# Patient Record
Sex: Male | Born: 1965
Health system: Southern US, Community
[De-identification: ages and names within clinical notes are randomized; demographics above are authoritative.]

## PROBLEM LIST (undated history)

## (undated) DIAGNOSIS — Z9889 Other specified postprocedural states: Secondary | ICD-10-CM

## (undated) DIAGNOSIS — M1990 Unspecified osteoarthritis, unspecified site: Secondary | ICD-10-CM

## (undated) DIAGNOSIS — N200 Calculus of kidney: Secondary | ICD-10-CM

## (undated) DIAGNOSIS — K469 Unspecified abdominal hernia without obstruction or gangrene: Secondary | ICD-10-CM

## (undated) DIAGNOSIS — R55 Syncope and collapse: Secondary | ICD-10-CM

## (undated) DIAGNOSIS — J309 Allergic rhinitis, unspecified: Secondary | ICD-10-CM

## (undated) DIAGNOSIS — J45909 Unspecified asthma, uncomplicated: Secondary | ICD-10-CM

## (undated) DIAGNOSIS — K4090 Unilateral inguinal hernia, without obstruction or gangrene, not specified as recurrent: Secondary | ICD-10-CM

## (undated) DIAGNOSIS — R112 Nausea with vomiting, unspecified: Secondary | ICD-10-CM

## (undated) HISTORY — DX: Unspecified abdominal hernia without obstruction or gangrene: K46.9

## (undated) HISTORY — DX: Allergic rhinitis, unspecified: J30.9

## (undated) HISTORY — DX: Unilateral inguinal hernia, without obstruction or gangrene, not specified as recurrent: K40.90

## (undated) HISTORY — DX: Unspecified osteoarthritis, unspecified site: M19.90

## (undated) HISTORY — DX: Unspecified asthma, uncomplicated: J45.909

## (undated) HISTORY — PX: HX GALL BLADDER SURGERY/CHOLE: SHX55

## (undated) HISTORY — PX: KNEE SURGERY: SHX244

## (undated) HISTORY — PX: HAND SURGERY: SHX662

## (undated) HISTORY — PX: HX HERNIA REPAIR: SHX51

## (undated) HISTORY — PX: HX SHOULDER SURGERY: 2100001311

---

## 1987-08-02 HISTORY — PX: HAND SURGERY: SHX662

## 2003-08-02 HISTORY — PX: KNEE SURGERY: SHX244

## 2004-08-01 HISTORY — PX: CHOLECYSTECTOMY: SHX55

## 2004-08-28 ENCOUNTER — Emergency Department (HOSPITAL_COMMUNITY): Admission: EM | Admit: 2004-08-28 | Discharge: 2004-08-29 | Payer: Self-pay | Admitting: Emergency Medicine

## 2004-09-30 ENCOUNTER — Encounter (INDEPENDENT_AMBULATORY_CARE_PROVIDER_SITE_OTHER): Payer: Self-pay | Admitting: Specialist

## 2004-09-30 ENCOUNTER — Ambulatory Visit (HOSPITAL_COMMUNITY): Admission: RE | Admit: 2004-09-30 | Discharge: 2004-09-30 | Payer: Self-pay | Admitting: General Surgery

## 2006-11-20 ENCOUNTER — Ambulatory Visit: Payer: Self-pay | Admitting: Cardiology

## 2006-12-07 ENCOUNTER — Ambulatory Visit: Payer: Self-pay | Admitting: Cardiology

## 2006-12-07 ENCOUNTER — Ambulatory Visit: Payer: Self-pay

## 2006-12-16 IMAGING — RF DG CHOLANGIOGRAM OPERATIVE
1 series · 4 of 4 positions shown · non-contrast
Comparison: none

CLINICAL DATA: Cholelithiasis, laparoscopic cholecystectomy.

INTRAOPERATIVE CHOLANGIOGRAM:
TECHNIQUE: Multiple fluoroscopic spot radiographs were obtained during
intraoperative cholangiogram, and are submitted for interpretation
post-operatively.

[Series 1: run · 4 of 52 frames shown]
[frame 8/52]
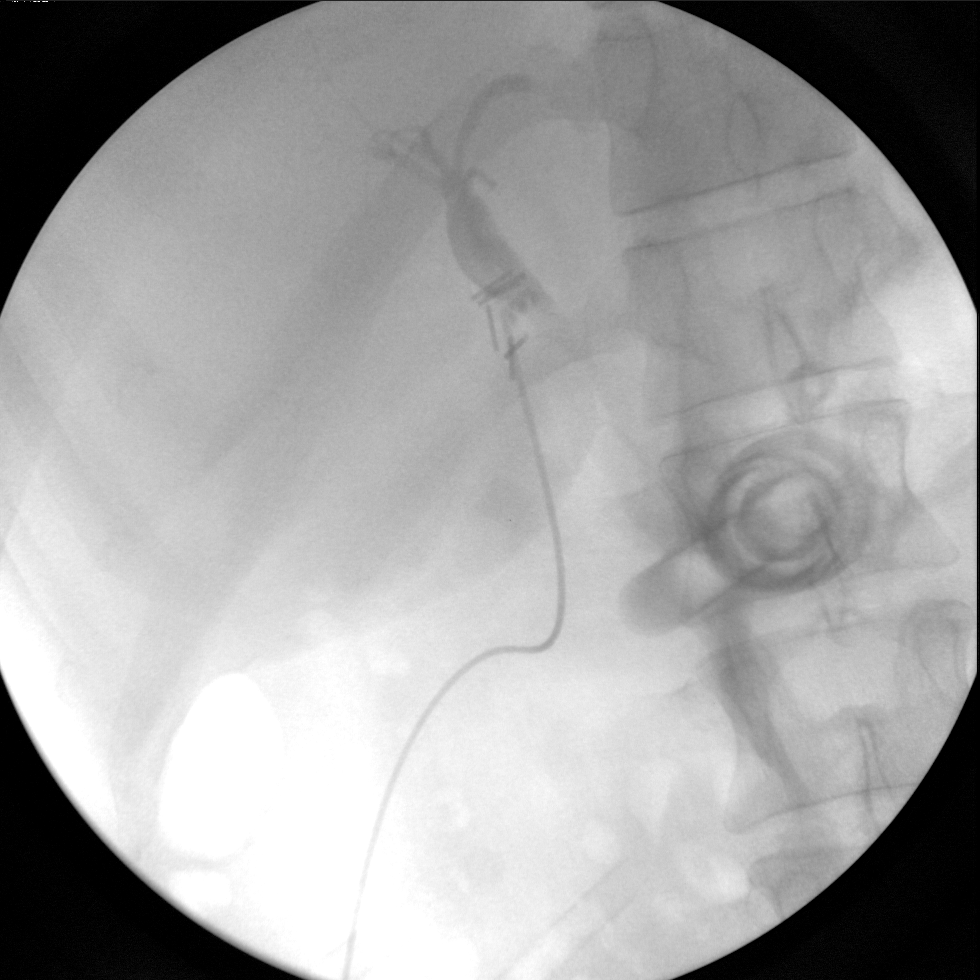
[frame 27/52]
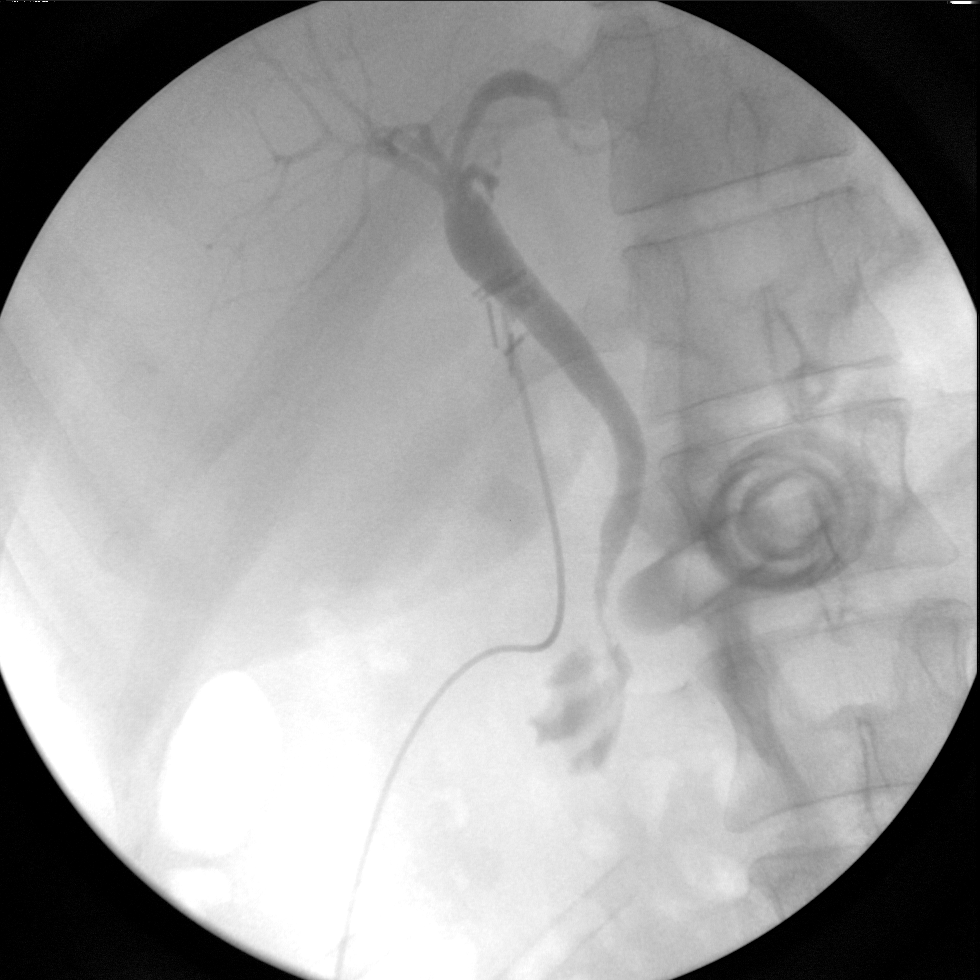
[frame 45/52]
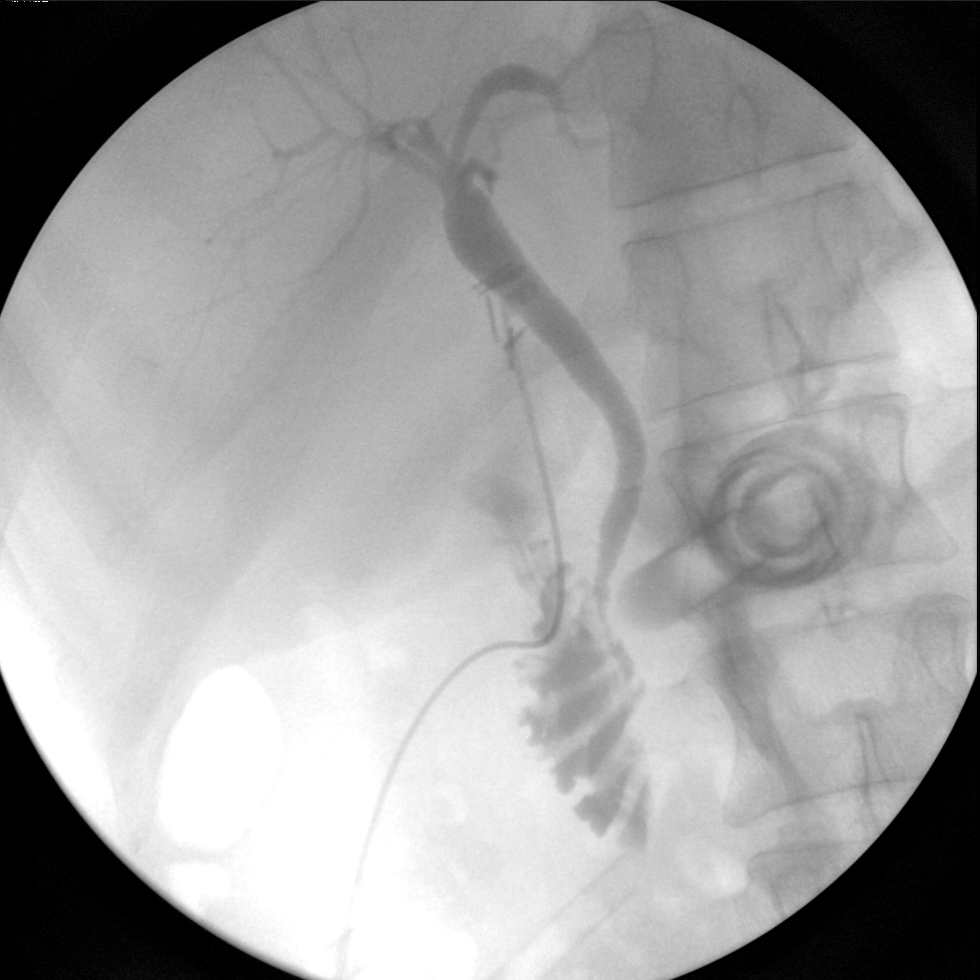
[frame 52/52]
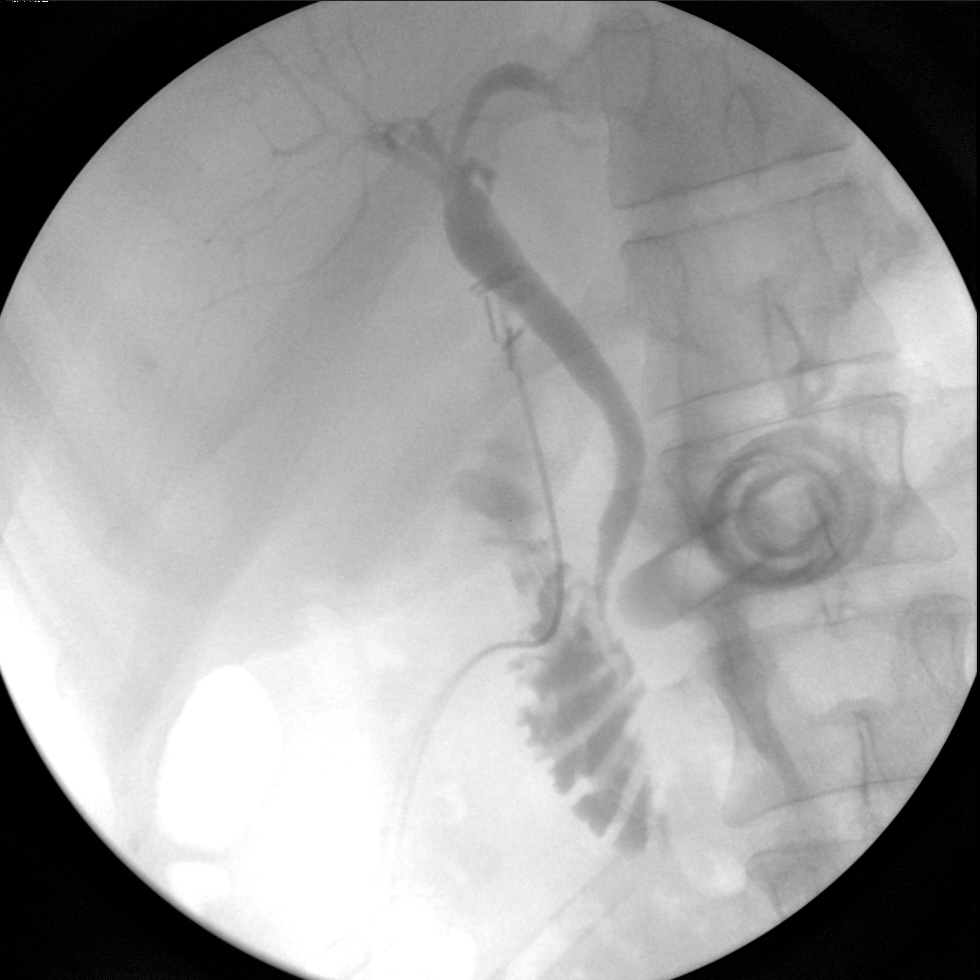

[4 of 4 positions shown; findings below may reference images not displayed]

FINDINGS: No calculi are identified within the common bile duct.  There is no
evidence of biliary stricture or obstruction.
IMPRESSION: Negative intraoperative cholangiogram.

## 2008-08-01 HISTORY — PX: SHOULDER SURGERY: SHX246

## 2009-04-21 DIAGNOSIS — Z9189 Other specified personal risk factors, not elsewhere classified: Secondary | ICD-10-CM | POA: Insufficient documentation

## 2009-04-21 DIAGNOSIS — Z9889 Other specified postprocedural states: Secondary | ICD-10-CM | POA: Insufficient documentation

## 2009-04-21 DIAGNOSIS — Z9089 Acquired absence of other organs: Secondary | ICD-10-CM | POA: Insufficient documentation

## 2010-12-14 NOTE — Procedures (Signed)
[facility]                              EXERCISE TREADMILL   NAME:[name], [name]                    MRN:          [id number]  DATE:12/07/2006                            DOB:          Jun 23, 1966    EXERCISE TREADMILL   The patient is a 45 year old male who recently presented with atypical  chest pain.  This study is performed for risk stratification.   The patient exercised for a duration of 10 minutes and 22 seconds on the  Bruce Protocol, which is equivalent of 12.3 METS.  His heart rate  increased from a resting of 90 to a maximum of 173, which is 95% of his  predicted maximum.  His blood pressure increased from 114/80 to 156/78.  There was no chest pain during this study.  There were no ST changes.  The study was terminated secondary to leg fatigue.   Final interpretation, negative adequate exercise tolerance test with no  chest pain, excellent exercise tolerance, and no ST changes.     Madolyn Frieze Jens Som, MD, [facility]  Electronically Signed    BSC/MedQ  DD: 12/07/2006  DT: 12/07/2006  Job #: [id number]   cc:   Anglea Greggory Stallion

## 2010-12-17 NOTE — Assessment & Plan Note (Signed)
[facility]                            CARDIOLOGY OFFICE NOTE   NAME:ROBINSONSophia, Cubero                    MRN:          [id number]  DATE:11/20/2006                            DOB:          20-Aug-1965    HISTORY OF PRESENT ILLNESS:  Mr. Thone is a very pleasant 45 year old  male with no prior cardiac history who presents for evaluation of chest  pain.  The patient typically does not have dyspnea on exertion,  orthopnea, PND, pedal edema, palpitations, presyncope, syncope, or  exertional chest pain.  Two to three months ago he had several episodes  of epigastric/substernal chest pain.  It lasted for several minutes and  resolved spontaneously.  It was described as a sharp pain.  There was no  radiation nor was there associated shortness of breath, nausea,  vomiting, or diaphoresis.  It was not pleuritic or positional nor was it  related to food.  It was not related to exertion.  He was concerned  about these symptoms and the development of coronary disease and  presented for further evaluation as a referral from Dr. Charm Barges.   MEDICATIONS:  His medications at present include bupropion 150 mg p.o.  daily and cetirizine 10 mg p.o. daily.  He also uses albuterol as  needed.   ALLERGIES:  HE HAS AN ALLERGY TO PENICILLIN.   FAMILY HISTORY:  Negative for coronary artery disease in his immediate  family.   SOCIAL HISTORY:  He has a remote history of tobacco use but has not  smoked since 1994.  He occasionally consumes alcohol.   PAST MEDICAL HISTORY:  There is no diabetes mellitus or hypertension.  He states his cholesterol has been borderline recently.  He has a remote  history of asthma.  He has had prior left knee surgery.  He has also had  surgery on his right hand due to an accident.  He has had a prior  cholecystectomy.   REVIEW OF SYSTEMS:  There is no headaches or fevers or chills.  There is  no productive cough or hemoptysis.  There is no  dysphagia, odynophagia,  melena, or hematochezia.  There is no dysuria or hematuria.  There is no  rash or seizure activity.  There is no orthopnea, PND, or pedal edema.  There is no claudication.  The remaining systems are negative.   PHYSICAL EXAMINATION:  VITAL SIGNS:  His physical exam today shows a  blood pressure of 124/70 and his pulse is 90.  He weighs 153 pounds.  GENERAL:  He is well-developed, well-nourished, no acute distress.  SKIN:  Warm and dry.  He does have a tattoo on his right shoulder.  He  does not appear to be depressed and there is no peripheral clubbing.  His back is normal.  HEENT:  Normal with normal eyelids.  His neck is supple with a normal  upstroke bilaterally and I cannot appreciate bruits.  There is no  jugular venous distention and no hepatomegaly is noted.  CHEST:  Clear to auscultation.  Normal expansion.  CARDIOVASCULAR:  Reveals regular rate and rhythm.  Normal S1  and S2.  There are no murmurs, rubs, or gallops noted.  His PMI is nondisplaced.  ABDOMINAL EXAM:  Nontender, nondistended, positive bowel sounds, no  hepatosplenomegaly, no mass appreciated.  There is no abdominal bruit.  He has 2+ femoral pulses bilaterally.  No bruits.  EXTREMITIES:  Show no edema and I can palpate no cords.  He has 2+  posterior tibial pulses bilaterally.  NEUROLOGIC:  His neurological exam is grossly intact.   ELECTROCARDIOGRAM:  His electrocardiogram shows a sinus rhythm at a rate  of 90.  There were no ST changes noted.   DIAGNOSES:  1. Atypical chest pain - his symptoms are very atypical but he is      concerned about these and the development of coronary disease.  We      will schedule him to have an exercise treadmill.  If it is normal      then we would not pursue further cardiac workup.  I discussed the      importance of risk factor modification today including avoiding      tobacco use, exercise and diet.  I will see him back on an as      needed basis  pending the results of his treadmill.  2. History of asthma - per primary care.     Madolyn Frieze Jens Som, MD, [facility]  Electronically Signed    BSC/MedQ  DD: 11/20/2006  DT: 11/20/2006  Job #: [id number]   cc:   [facility] Prudy Feeler MD  Samuel Jester

## 2010-12-17 NOTE — Op Note (Signed)
NAME:  Lance Murphy, Lance Murphy             ACCOUNT NO.:  [id number]   MEDICAL RECORD NO.:  [id number]          PATIENT TYPE:  OIB   LOCATION:  2550                         FACILITY:  [facility]   PHYSICIAN:  Cherylynn Ridges III, M.D.DATE OF BIRTH:  [birth year]   DATE OF PROCEDURE:  09/30/2004  DATE OF DISCHARGE:                                 OPERATIVE REPORT   PREOPERATIVE DIAGNOSES:  Symptomatic cholelithiasis.   POSTOPERATIVE DIAGNOSES:  Symptomatic cholelithiasis.   OPERATION PERFORMED:  Laparoscopic cholecystectomy with cholangiogram.   SURGEON:  Marta Lamas. Lindie Spruce, M.D.   ANESTHESIA:  General endotracheal.   ESTIMATED BLOOD LOSS:  Less than 20 mL.   COMPLICATIONS:  None.   CONDITION:  Stable.   FINDINGS:  Normal cholangiogram.  Very few stones in the gallbladder.  Some  evidence of acute inflammation with adhesions to the gallbladder.   INDICATIONS FOR PROCEDURE:  The patient is a 44 year old male who had been  seen in the emergency room with gallstones and symptoms related to those,  who now comes in for an elective laparoscopic cholecystectomy.   DESCRIPTION OF PROCEDURE:  The patient was taken to the operating room and  placed on the table in the supine position.  After an adequate endotracheal  anesthetic was administered, the patient was prepped and draped in the usual  sterile manner exposing the midline in the right upper quadrant.  Initially,  a supraumbilical curvilinear incision was made using a #11 blade and taken  down to the midline fascia.  Through this midline fascia, a Veress needle  was passed into the peritoneal cavity and confirmed to be in the correct  position using the saline test.  Once this was done, carbon dioxide gas was  insufflated through the Veress needle into the peritoneal cavity up to a  maximal intra-abdominal pressure of 15 mmHg.  Once this was done and the 11-  12 mm was passed with the OptiVue entering cannula and the scope inserted  into the  cavity under direct vision.  We confirmed its placement with a  laparoscope and attached camera and light source and then subsequently  passed two right costal margin 5 mm cannulas in the subxiphoid 10-11 mm  cannula under direct vision.  The patient was placed in steep reverse  Trendelenburg, the left side was tilted down and the dissection begun.  The  dome of the gallbladder was grasped using a ratcheted grasper through the  lateral most 5 mm cannula.  It was retracted towards the anterior abdominal  wall and the right upper quadrant. There were adhesions to the body and  infundibulum of the gallbladder which were bluntly dissected away and  sharply dissected away using electrocautery and a dissector.  This helped [place]  expose the peritoneum over the triangle of Calot and the hepatoduodenal  triangle.  By dissecting away this peritoneum, the cystic duct and the  cystic artery could be identified.  Both were adequately mobilized with  windows around them and you could see on both sides, and see the cystic duct  clearly going to the gallbladder.  We endoclipped the cystic duct  along the  gallbladder side and then doubly clipped the cystic artery distally and  singly proximally and transected that.  We made a cholecystodochotomy using  the endoscopic scissors and through this cholecystodochotomy a Cook catheter  was passed for the cholangiogram. this was done showing good flow into the  duodenum.  No filling defects, tapering distally, proximal filling without  evidence of obstruction or dilatation.   Once the cholangiogram was completed, we removed the securing clip, removed  the catheter and then triply clipped the cystic duct distal to the  cholecystodochotomy.  We transected the cystic duct and then dissected the  gallbladder out of its hepatic bed with minimal difficulty.  There was no  entrance into the gallbladder and we were able to retrieve it from the  supraumbilical site with  minimal difficulty without using an EndoCatch bag.   Once the gallbladder was removed, we inspected the bed and there was minimal  to no bleeding.  We irrigated with about 300 to 400 cc of warm saline  solution, then removed all gas from above the liver using the aspirator and  then removed all cannulas.  0.25% Marcaine with epinephrine was injected at  all the incision sites, then this totaled about approximately 20 cc.  We  then reapproximated the supraumbilical fascia using a figure-of-eight stitch  of 0 Vicryl passed on a UR-6 needle.  The skin at all sites was closed using  a running subcuticular stitch of 4-0 Vicryl.  Steri-Strips were applied to  all wounds, then a sterile dressing.  All sponge, needle and instrument  counts were correct at the conclusion of the case.      JOW/MEDQ  D:  09/30/2004  T:  09/30/2004  Job:  [id number]

## 2012-07-30 ENCOUNTER — Encounter (INDEPENDENT_AMBULATORY_CARE_PROVIDER_SITE_OTHER): Payer: Self-pay | Admitting: General Surgery

## 2012-08-02 ENCOUNTER — Encounter (INDEPENDENT_AMBULATORY_CARE_PROVIDER_SITE_OTHER): Payer: Self-pay | Admitting: General Surgery

## 2012-08-02 ENCOUNTER — Ambulatory Visit (INDEPENDENT_AMBULATORY_CARE_PROVIDER_SITE_OTHER): Payer: 59 | Admitting: General Surgery

## 2012-08-02 VITALS — BP 128/76 | HR 74 | Temp 97.9°F | Resp 18 | Ht 70.0 in | Wt 152.2 lb

## 2012-08-02 DIAGNOSIS — K4020 Bilateral inguinal hernia, without obstruction or gangrene, not specified as recurrent: Secondary | ICD-10-CM

## 2012-08-02 NOTE — Patient Instructions (Signed)
Your exam today confirms an obvious reducible right inguinal hernia. There also appears to be a weakness and slight bulge on the left side.  After lengthy discussion, we have decided to go ahead with scheduling of laparoscopic repair of bilateral inguinal hernias with mesh, possible open     Inguinal Hernia, Adult Muscles help keep everything in the body in its proper place. But if a weak spot in the muscles develops, something can poke through. That is called a hernia. When this happens in the lower part of the belly (abdomen), it is called an inguinal hernia. (It takes its name from a part of the body in this region called the inguinal canal.) A weak spot in the wall of muscles lets some fat or part of the small intestine bulge through. An inguinal hernia can develop at any age. Men get them more often than women. CAUSES  In adults, an inguinal hernia develops over time.  It can be triggered by:  Suddenly straining the muscles of the lower abdomen.  Lifting heavy objects.  Straining to have a bowel movement. Difficult bowel movements (constipation) can lead to this.  Constant coughing. This may be caused by smoking or lung disease.  Being overweight.  Being pregnant.  Working at a job that requires long periods of standing or heavy lifting.  Having had an inguinal hernia before. One type can be an emergency situation. It is called a strangulated inguinal hernia. It develops if part of the small intestine slips through the weak spot and cannot get back into the abdomen. The blood supply can be cut off. If that happens, part of the intestine may die. This situation requires emergency surgery. SYMPTOMS  Often, a small inguinal hernia has no symptoms. It is found when a healthcare provider does a physical exam. Larger hernias usually have symptoms.   In adults, symptoms may include:  A lump in the groin. This is easier to see when the person is standing. It might disappear when lying  down.  In men, a lump in the scrotum.  Pain or burning in the groin. This occurs especially when lifting, straining or coughing.  A dull ache or feeling of pressure in the groin.  Signs of a strangulated hernia can include:  A bulge in the groin that becomes very painful and tender to the touch.  A bulge that turns red or purple.  Fever, nausea and vomiting.  Inability to have a bowel movement or to pass gas. DIAGNOSIS  To decide if you have an inguinal hernia, a healthcare provider will probably do a physical examination.  This will include asking questions about any symptoms you have noticed.  The healthcare provider might feel the groin area and ask you to cough. If an inguinal hernia is felt, the healthcare provider may try to slide it back into the abdomen.  Usually no other tests are needed. TREATMENT  Treatments can vary. The size of the hernia makes a difference. Options include:  Watchful waiting. This is often suggested if the hernia is small and you have had no symptoms.  No medical procedure will be done unless symptoms develop.  You will need to watch closely for symptoms. If any occur, contact your healthcare provider right away.  Surgery. This is used if the hernia is larger or you have symptoms.  Open surgery. This is usually an outpatient procedure (you will not stay overnight in a hospital). An cut (incision) is made through the skin in the groin. The  hernia is put back inside the abdomen. The weak area in the muscles is then repaired by herniorrhaphy or hernioplasty. Herniorrhaphy: in this type of surgery, the weak muscles are sewn back together. Hernioplasty: a patch or mesh is used to close the weak area in the abdominal wall.  Laparoscopy. In this procedure, a surgeon makes small incisions. A thin tube with a tiny video camera (called a laparoscope) is put into the abdomen. The surgeon repairs the hernia with mesh by looking with the video camera and using  two long instruments. HOME CARE INSTRUCTIONS   After surgery to repair an inguinal hernia:  You will need to take pain medicine prescribed by your healthcare provider. Follow all directions carefully.  You will need to take care of the wound from the incision.  Your activity will be restricted for awhile. This will probably include no heavy lifting for several weeks. You also should not do anything too active for a few weeks. When you can return to work will depend on the type of job that you have.  During "watchful waiting" periods, you should:  Maintain a healthy weight.  Eat a diet high in fiber (fruits, vegetables and whole grains).  Drink plenty of fluids to avoid constipation. This means drinking enough water and other liquids to keep your urine clear or pale yellow.  Do not lift heavy objects.  Do not stand for long periods of time.  Quit smoking. This should keep you from developing a frequent cough. SEEK MEDICAL CARE IF:   A bulge develops in your groin area.  You feel pain, a burning sensation or pressure in the groin. This might be worse if you are lifting or straining.  You develop a fever of more than 100.5 F (38.1 C). SEEK IMMEDIATE MEDICAL CARE IF:   Pain in the groin increases suddenly.  A bulge in the groin gets bigger suddenly and does not go down.  For men, there is sudden pain in the scrotum. Or, the size of the scrotum increases.  A bulge in the groin area becomes red or purple and is painful to touch.  You have nausea or vomiting that does not go away.  You feel your heart beating much faster than normal.  You cannot have a bowel movement or pass gas.  You develop a fever of more than 102.0 F (38.9 C). Document Released: 12/04/2008 Document Revised: 10/10/2011 Document Reviewed: 12/04/2008 [facility] Patient Information 2013 [place], [place].

## 2012-08-02 NOTE — Progress Notes (Signed)
Patient ID: Lance Murphy, male   DOB: 08-13-1965, 47 y.o.   MRN: [id number]  Chief Complaint  Patient presents with  . Inguinal Hernia    HPI Lance Murphy is a 47 y.o. male.  He is referred by Prudy Feeler, PA at [facility] in stools, for evaluation of a symptomatic right inguinal hernia.  This gentleman is here today with his wife. He works in [employer]. They travel from job site to job site and have a large trailer. His health is good except for some occasional asthma symptoms. He's had a laparoscopic cholecystectomy in 2008. He had an injury to his right hand several years ago requiring a right groin flap temporarily.  he does not smoke.  He noticed a bulge in his right groin about one month ago. It's been getting larger and he has a double which is reducible. He gets a pulling sensation. No symptoms elsewhere. HPI  Past Medical History  Diagnosis Date  . Allergic rhinitis   . Osteoarthritis   . Asthma   . Hernia of unspecified site of abdominal cavity without mention of obstruction or gangrene   . Inguinal hernia     Past Surgical History  Procedure Date  . Knee surgery 2005    due to accident  . Shoulder surgery 2010    due to accident  . Cholecystectomy 2006  . Hand surgery 1989    History reviewed. No pertinent family history.  Social History History  Substance Use Topics  . Smoking status: Former Smoker    Quit date: 08/02/1991  . Smokeless tobacco: Never Used  . Alcohol Use: No    Allergies  Allergen Reactions  . Cortisone Other (See Comments)    Syncope  . Penicillins     Does not know of the reaction, happened in childhood.    Current Outpatient Prescriptions  Medication Sig Dispense Refill  . Acetaminophen (TYLENOL 8 HOUR PO) Take by mouth as needed.      . B Complex Vitamins (VITAMIN B COMPLEX PO) Take by mouth as directed.      . cetirizine (ZYRTEC) 10 MG tablet Take 10 mg by mouth daily.      . Multiple Vitamins-Minerals  (MULTIVITAMIN PO) Take by mouth as directed.      . Omega-3 Fatty Acids (FISH OIL) 1000 MG CAPS Take by mouth daily.      Marland Kitchen albuterol (PROVENTIL HFA;VENTOLIN HFA) 108 (90 BASE) MCG/ACT inhaler Inhale 2 puffs into the lungs daily.        Review of Systems Review of Systems  Constitutional: Negative for fever, chills and unexpected weight change.  HENT: Negative for hearing loss, congestion, sore throat, trouble swallowing and voice change.   Eyes: Negative for visual disturbance.  Respiratory: Negative for cough and wheezing.   Cardiovascular: Negative for chest pain, palpitations and leg swelling.  Gastrointestinal: Negative for nausea, vomiting, abdominal pain, diarrhea, constipation, blood in stool, abdominal distention, anal bleeding and rectal pain.  Genitourinary: Negative for hematuria and difficulty urinating.  Musculoskeletal: Negative for arthralgias.  Skin: Negative for rash and wound.  Neurological: Negative for seizures, syncope, weakness and headaches.  Hematological: Negative for adenopathy. Does not bruise/bleed easily.  Psychiatric/Behavioral: Negative for confusion.    Blood pressure 128/76, pulse 74, temperature 97.9 F (36.6 C), temperature source Temporal, resp. rate 18, height 5\' 10"  (1.778 m), weight 152 lb 4 oz (69.06 kg).  Physical Exam Physical Exam  Constitutional: He is oriented to person, place, and time. He  appears well-developed and well-nourished. No distress.  HENT:  Head: Normocephalic.  Nose: Nose normal.  Mouth/Throat: No oropharyngeal exudate.  Eyes: Conjunctivae normal and EOM are normal. Pupils are equal, round, and reactive to light. Right eye exhibits no discharge. Left eye exhibits no discharge. No scleral icterus.  Neck: Normal range of motion. Neck supple. No JVD present. No tracheal deviation present. No thyromegaly present.  Cardiovascular: Normal rate, regular rhythm, normal heart sounds and intact distal pulses.   No murmur  heard. Pulmonary/Chest: Effort normal and breath sounds normal. No stridor. No respiratory distress. He has no wheezes. He has no rales. He exhibits no tenderness.  Abdominal: Soft. Bowel sounds are normal. He exhibits no distension and no mass. There is no tenderness. There is no rebound and no guarding.       Small transverse scar above the umbilicus from cholecystectomy. No hernia there. They have a very tiny epigastric hernia above.  Genitourinary:       Obvious medium sized right inguinal hernia, does not extend into the scrotum, reducible. There is a weakness and slight bulge on the left side. Penis scrotum and testes are normal. No scrotal mass.  Musculoskeletal: Normal range of motion. He exhibits no edema and no tenderness.       Deformity and skin graft right hand, index finger and middle finger.  Lymphadenopathy:    He has no cervical adenopathy.  Neurological: He is alert and oriented to person, place, and time. He has normal reflexes. Coordination normal.  Skin: Skin is warm and dry. No rash noted. He is not diaphoretic. No erythema. No pallor.       Transverse, oblique scar right groin area, well lateral to the hernia. Well-healed. History of right groin flap and hand surgery.  Psychiatric: He has a normal mood and affect. His behavior is normal. Judgment and thought content normal.    Data Reviewed None available  Assessment    Reducible right inguinal hernia, probable early left inguinal hernia. Patient desires elective repair  History laparoscopic cholecystectomy with transverse trocar incision above the umbilicus  Mild asthma, symptoms very intermittent  Penicillin allergy as child, reaction unknown  Otherwise excellent health    Plan    We discussed open repair and laparoscopic repair. We discussed with her to address the small bulge on the left side. At the end of discussion he has elected to have a laparoscopic repair of his right inguinal hernia and probably we  will place mesh on the left side as well. He knows that this will possibly need to be converted to open but hopefully not.  I discussed the indications, details, techniques, and numerous risks of the surgery with him and his wife. He understands these issues. All of his questions are answered.  He agrees with this plan. He would like to schedule surgery in the near future.       Angelia Mould. Derrell Lolling, M.D., [facility] General and Minimally invasive Surgery Breast and Colorectal Surgery Office:   [phone] Pager:   [phone]  08/02/2012, 9:27 AM

## 2012-08-10 ENCOUNTER — Encounter (HOSPITAL_COMMUNITY): Payer: Self-pay | Admitting: Pharmacy Technician

## 2012-08-13 NOTE — Patient Instructions (Signed)
Lance Murphy  08/13/2012   Your procedure is scheduled on:08/22/12     Report to [facility] at 1015 AM.  Call this number if you have problems the morning of surgery: [phone]   Remember:   Do not eat food after midnite.  May have clear liquids until 0600am then npo.    Take these medicines the morning of surgery with A SIP OF WATER:    Do not wear jewelry,   Do not wear lotions, powders, or perfumes.    . Men may shave face and neck.  Do not bring valuables to the hospital.  Contacts, dentures or bridgework may not be worn into surgery.       Patients discharged the day of surgery will not be allowed to drive home.  Name and phone number of your driver:               SEE CHG INSTRUCTION SHEET    Please read over the following fact sheets that you were given: MRSA Information, coughing and deep breathing exercises, leg exercises              Failure to comply with these instructions may result in cancellation of your surgery.              Patient Signature _____________________________              Nurse Signature _____________________________

## 2012-08-14 ENCOUNTER — Inpatient Hospital Stay (HOSPITAL_COMMUNITY): Admission: RE | Admit: 2012-08-14 | Discharge: 2012-08-14 | Payer: Self-pay | Source: Ambulatory Visit

## 2012-08-15 ENCOUNTER — Encounter (HOSPITAL_COMMUNITY): Payer: Self-pay | Admitting: *Deleted

## 2012-08-21 NOTE — H&P (Signed)
Lance Murphy    MRN: [id number]   Description: 47 year old male  Provider: Ernestene Mention, MD  Department: [facility]      Diagnoses     Bilateral inguinal hernia   - Primary    550.92       Vitals   BP Pulse Temp Resp Ht Wt    128/76 74 97.9 F (36.6 C) (Temporal) 18 5\' 10"  (1.778 m) 152 lb 4 oz (69.06 kg)     BMI - 21.85 kg/m2                 History and Physical    Ernestene Mention, MD   Patient ID: Lance Murphy, male   DOB: 08-29-1965, 47 y.o.   MRN: [id number]           HPI [name] is a 47 y.o. male.  He is referred by Prudy Feeler, PA at [facility] in [place], for evaluation of a symptomatic right inguinal hernia.   This gentleman is here today with his wife. He works in [employer]. They travel from job site to job site and have a large trailer. His health is good except for some occasional asthma symptoms. He's had a laparoscopic cholecystectomy in 2008. He had an injury to his right hand several years ago requiring a right groin flap temporarily.  he does not smoke.   He noticed a bulge in his right groin about one month ago. It's been getting larger and he has a double which is reducible. He gets a pulling sensation. No symptoms elsewhere.       Past Medical History   Diagnosis  Date   .  Allergic rhinitis     .  Osteoarthritis     .  Asthma     .  Hernia of unspecified site of abdominal cavity without mention of obstruction or gangrene     .  Inguinal hernia         Past Surgical History   Procedure  Date   .  Knee surgery  2005       due to accident   .  Shoulder surgery  2010       due to accident   .  Cholecystectomy  2006   .  Hand surgery  1989      History reviewed. No pertinent family history.   Social History History   Substance Use Topics   .  Smoking status:  Former Smoker       Quit date:  08/02/1991   .  Smokeless tobacco:  Never Used   .  Alcohol Use:  No       Allergies     Allergen  Reactions   .  Cortisone  Other (See Comments)       Syncope   .  Penicillins         Does not know of the reaction, happened in childhood.       Current Outpatient Prescriptions   Medication  Sig  Dispense  Refill   .  Acetaminophen (TYLENOL 8 HOUR PO)  Take by mouth as needed.         .  B Complex Vitamins (VITAMIN B COMPLEX PO)  Take by mouth as directed.         .  cetirizine (ZYRTEC) 10 MG tablet  Take 10 mg by mouth daily.         .  Multiple  Vitamins-Minerals (MULTIVITAMIN PO)  Take by mouth as directed.         .  Omega-3 Fatty Acids (FISH OIL) 1000 MG CAPS  Take by mouth daily.         Marland Kitchen  albuterol (PROVENTIL HFA;VENTOLIN HFA) 108 (90 BASE) MCG/ACT inhaler  Inhale 2 puffs into the lungs daily.            Review of Systems   Constitutional: Negative for fever, chills and unexpected weight change.  HENT: Negative for hearing loss, congestion, sore throat, trouble swallowing and voice change.   Eyes: Negative for visual disturbance.  Respiratory: Negative for cough and wheezing.   Cardiovascular: Negative for chest pain, palpitations and leg swelling.  Gastrointestinal: Negative for nausea, vomiting, abdominal pain, diarrhea, constipation, blood in stool, abdominal distention, anal bleeding and rectal pain.  Genitourinary: Negative for hematuria and difficulty urinating.  Musculoskeletal: Negative for arthralgias.  Skin: Negative for rash and wound.  Neurological: Negative for seizures, syncope, weakness and headaches.  Hematological: Negative for adenopathy. Does not bruise/bleed easily.  Psychiatric/Behavioral: Negative for confusion.    Blood pressure 128/76, pulse 74, temperature 97.9 F (36.6 C), temperature source Temporal, resp. rate 18, height 5\' 10"  (1.778 m), weight 152 lb 4 oz (69.06 kg).   Physical Exam  Constitutional: He is oriented to person, place, and time. He appears well-developed and well-nourished. No distress.  HENT:   Head:  Normocephalic.   Nose: Nose normal.   Mouth/Throat: No oropharyngeal exudate.  Eyes: Conjunctivae normal and EOM are normal. Pupils are equal, round, and reactive to light. Right eye exhibits no discharge. Left eye exhibits no discharge. No scleral icterus.  Neck: Normal range of motion. Neck supple. No JVD present. No tracheal deviation present. No thyromegaly present.  Cardiovascular: Normal rate, regular rhythm, normal heart sounds and intact distal pulses.    No murmur heard. Pulmonary/Chest: Effort normal and breath sounds normal. No stridor. No respiratory distress. He has no wheezes. He has no rales. He exhibits no tenderness.  Abdominal: Soft. Bowel sounds are normal. He exhibits no distension and no mass. There is no tenderness. There is no rebound and no guarding.       Small transverse scar above the umbilicus from cholecystectomy. No hernia there. They have a very tiny epigastric hernia above.  Genitourinary:       Obvious medium sized right inguinal hernia, does not extend into the scrotum, reducible. There is a weakness and slight bulge on the left side. Penis scrotum and testes are normal. No scrotal mass.  Musculoskeletal: Normal range of motion. He exhibits no edema and no tenderness.       Deformity and skin graft right hand, index finger and middle finger.  Lymphadenopathy:    He has no cervical adenopathy.  Neurological: He is alert and oriented to person, place, and time. He has normal reflexes. Coordination normal.  Skin: Skin is warm and dry. No rash noted. He is not diaphoretic. No erythema. No pallor.       Transverse, oblique scar right groin area, well lateral to the hernia. Well-healed. History of right groin flap and hand surgery.  Psychiatric: He has a normal mood and affect. His behavior is normal. Judgment and thought content normal.       Assessment Reducible right inguinal hernia, probable early left inguinal hernia. Patient desires elective  repair   History laparoscopic cholecystectomy with transverse trocar incision above the umbilicus   Mild asthma, symptoms very intermittent   Penicillin  allergy as child, reaction unknown   Otherwise excellent health   Plan We discussed open repair and laparoscopic repair. We discussed with her to address the small bulge on the left side. At the end of discussion he has elected to have a laparoscopic repair of his right inguinal hernia and probably we will place mesh on the left side as well. He knows that this will possibly need to be converted to open but hopefully not.   I discussed the indications, details, techniques, and numerous risks of the surgery with him and his wife. He understands these issues. All of his questions are answered.  He agrees with this plan. He would like to schedule surgery in the near future.       Angelia Mould. Derrell Lolling, M.D., [facility] General and Minimally invasive Surgery Breast and Colorectal Surgery Office:   [phone] Pager:   [phone]

## 2012-08-22 ENCOUNTER — Encounter (HOSPITAL_COMMUNITY): Payer: Self-pay | Admitting: Anesthesiology

## 2012-08-22 ENCOUNTER — Ambulatory Visit (HOSPITAL_COMMUNITY): Payer: 59 | Admitting: Anesthesiology

## 2012-08-22 ENCOUNTER — Encounter (HOSPITAL_COMMUNITY): Payer: Self-pay | Admitting: *Deleted

## 2012-08-22 ENCOUNTER — Ambulatory Visit (HOSPITAL_COMMUNITY)
Admission: RE | Admit: 2012-08-22 | Discharge: 2012-08-22 | Disposition: A | Discharge status: Home / Self Care | Payer: 59 | Source: Ambulatory Visit | Attending: General Surgery | Admitting: General Surgery

## 2012-08-22 ENCOUNTER — Encounter (HOSPITAL_COMMUNITY)
Admission: RE | Disposition: A | Discharge status: Home / Self Care | Payer: Self-pay | Source: Ambulatory Visit | Attending: General Surgery

## 2012-08-22 DIAGNOSIS — J45909 Unspecified asthma, uncomplicated: Secondary | ICD-10-CM | POA: Insufficient documentation

## 2012-08-22 DIAGNOSIS — K4020 Bilateral inguinal hernia, without obstruction or gangrene, not specified as recurrent: Secondary | ICD-10-CM | POA: Insufficient documentation

## 2012-08-22 HISTORY — DX: Other specified postprocedural states: Z98.890

## 2012-08-22 HISTORY — PX: INSERTION OF MESH: SHX5868

## 2012-08-22 HISTORY — DX: Calculus of kidney: N20.0

## 2012-08-22 HISTORY — DX: Other specified postprocedural states: R11.2

## 2012-08-22 HISTORY — DX: Syncope and collapse: R55

## 2012-08-22 HISTORY — PX: INGUINAL HERNIA REPAIR: SHX194

## 2012-08-22 LAB — SURGICAL PCR SCREEN: Staphylococcus aureus: NEGATIVE

## 2012-08-22 SURGERY — REPAIR, HERNIA, INGUINAL, BILATERAL, LAPAROSCOPIC
Anesthesia: General | Laterality: Bilateral | Wound class: Clean

## 2012-08-22 MED ORDER — SUCCINYLCHOLINE CHLORIDE 20 MG/ML IJ SOLN
INTRAMUSCULAR | Status: DC | PRN
  Administered 2012-08-22: 100 mg via INTRAVENOUS

## 2012-08-22 MED ORDER — BUPIVACAINE-EPINEPHRINE (PF) 0.5% -1:200000 IJ SOLN
INTRAMUSCULAR | Status: AC
  Filled 2012-08-22: qty 10

## 2012-08-22 MED ORDER — NEOSTIGMINE METHYLSULFATE 1 MG/ML IJ SOLN
INTRAMUSCULAR | Status: DC | PRN
  Administered 2012-08-22: 4 mg via INTRAVENOUS

## 2012-08-22 MED ORDER — OXYCODONE HCL 5 MG PO TABS
ORAL_TABLET | ORAL | Status: AC
  Administered 2012-08-22: 5 mg via ORAL
  Filled 2012-08-22: qty 1

## 2012-08-22 MED ORDER — HYDROMORPHONE HCL PF 1 MG/ML IJ SOLN
INTRAMUSCULAR | Status: AC
  Filled 2012-08-22: qty 1

## 2012-08-22 MED ORDER — ACETAMINOPHEN 650 MG RE SUPP
650.0000 mg | RECTAL | Status: DC | PRN
  Filled 2012-08-22: qty 1

## 2012-08-22 MED ORDER — HYDROCODONE-ACETAMINOPHEN 5-325 MG PO TABS: 1.0000 | ORAL_TABLET | ORAL | Status: DC | PRN

## 2012-08-22 MED ORDER — ACETAMINOPHEN 10 MG/ML IV SOLN: 1000.0000 mg | Freq: Once | INTRAVENOUS | Status: DC | PRN

## 2012-08-22 MED ORDER — ONDANSETRON HCL 4 MG/2ML IJ SOLN
INTRAMUSCULAR | Status: DC | PRN
  Administered 2012-08-22: 4 mg via INTRAVENOUS

## 2012-08-22 MED ORDER — SODIUM CHLORIDE 0.9 % IV SOLN: INTRAVENOUS | Status: DC

## 2012-08-22 MED ORDER — ONDANSETRON HCL 4 MG/2ML IJ SOLN: 4.0000 mg | Freq: Four times a day (QID) | INTRAMUSCULAR | Status: DC | PRN

## 2012-08-22 MED ORDER — SODIUM CHLORIDE 0.9 % IJ SOLN: 3.0000 mL | Freq: Two times a day (BID) | INTRAMUSCULAR | Status: DC

## 2012-08-22 MED ORDER — LACTATED RINGERS IR SOLN
Status: DC | PRN
  Administered 2012-08-22: 1000 mL

## 2012-08-22 MED ORDER — MORPHINE SULFATE 10 MG/ML IJ SOLN: 2.0000 mg | INTRAMUSCULAR | Status: DC | PRN

## 2012-08-22 MED ORDER — OXYCODONE HCL 5 MG/5ML PO SOLN
5.0000 mg | Freq: Once | ORAL | Status: DC | PRN
  Filled 2012-08-22: qty 5

## 2012-08-22 MED ORDER — SUFENTANIL CITRATE 50 MCG/ML IV SOLN
INTRAVENOUS | Status: DC | PRN
  Administered 2012-08-22: 10 ug via INTRAVENOUS
  Administered 2012-08-22: 5 ug via INTRAVENOUS
  Administered 2012-08-22: 15 ug via INTRAVENOUS

## 2012-08-22 MED ORDER — SCOPOLAMINE 1 MG/3DAYS TD PT72
MEDICATED_PATCH | TRANSDERMAL | Status: DC | PRN
  Administered 2012-08-22: 1 via TRANSDERMAL

## 2012-08-22 MED ORDER — CISATRACURIUM BESYLATE (PF) 10 MG/5ML IV SOLN
INTRAVENOUS | Status: DC | PRN
  Administered 2012-08-22: 6 mg via INTRAVENOUS
  Administered 2012-08-22: 2 mg via INTRAVENOUS

## 2012-08-22 MED ORDER — OXYCODONE HCL 5 MG PO TABS
5.0000 mg | ORAL_TABLET | ORAL | Status: DC | PRN
  Administered 2012-08-22 (×2): 5 mg via ORAL

## 2012-08-22 MED ORDER — SCOPOLAMINE 1 MG/3DAYS TD PT72
MEDICATED_PATCH | TRANSDERMAL | Status: AC
  Filled 2012-08-22: qty 1

## 2012-08-22 MED ORDER — SODIUM CHLORIDE 0.9 % IV SOLN: 250.0000 mL | INTRAVENOUS | Status: DC | PRN

## 2012-08-22 MED ORDER — PROPOFOL 10 MG/ML IV BOLUS
INTRAVENOUS | Status: DC | PRN
  Administered 2012-08-22: 200 mg via INTRAVENOUS

## 2012-08-22 MED ORDER — CEFAZOLIN SODIUM-DEXTROSE 2-3 GM-% IV SOLR
INTRAVENOUS | Status: AC
  Filled 2012-08-22: qty 50

## 2012-08-22 MED ORDER — CEFAZOLIN SODIUM-DEXTROSE 2-3 GM-% IV SOLR
2.0000 g | INTRAVENOUS | Status: AC
  Administered 2012-08-22: 2 g via INTRAVENOUS

## 2012-08-22 MED ORDER — KETOROLAC TROMETHAMINE 30 MG/ML IJ SOLN
INTRAMUSCULAR | Status: DC | PRN
  Administered 2012-08-22: 30 mg via INTRAVENOUS

## 2012-08-22 MED ORDER — ACETAMINOPHEN 325 MG PO TABS: 650.0000 mg | ORAL_TABLET | ORAL | Status: DC | PRN

## 2012-08-22 MED ORDER — BUPIVACAINE-EPINEPHRINE 0.5% -1:200000 IJ SOLN
INTRAMUSCULAR | Status: DC | PRN
  Administered 2012-08-22: 20 mL

## 2012-08-22 MED ORDER — MEPERIDINE HCL 50 MG/ML IJ SOLN: 6.2500 mg | INTRAMUSCULAR | Status: DC | PRN

## 2012-08-22 MED ORDER — LACTATED RINGERS IV SOLN
INTRAVENOUS | Status: DC
  Administered 2012-08-22: 14:00:00 via INTRAVENOUS
  Administered 2012-08-22: 1000 mL via INTRAVENOUS

## 2012-08-22 MED ORDER — SODIUM CHLORIDE 0.9 % IJ SOLN: 3.0000 mL | INTRAMUSCULAR | Status: DC | PRN

## 2012-08-22 MED ORDER — MIDAZOLAM HCL 5 MG/5ML IJ SOLN
INTRAMUSCULAR | Status: DC | PRN
  Administered 2012-08-22: 2 mg via INTRAVENOUS

## 2012-08-22 MED ORDER — CHLORHEXIDINE GLUCONATE 4 % EX LIQD
1.0000 "application " | Freq: Once | CUTANEOUS | Status: DC
  Filled 2012-08-22: qty 15

## 2012-08-22 MED ORDER — GLYCOPYRROLATE 0.2 MG/ML IJ SOLN
INTRAMUSCULAR | Status: DC | PRN
  Administered 2012-08-22: .6 mg via INTRAVENOUS

## 2012-08-22 MED ORDER — LIDOCAINE HCL (CARDIAC) 20 MG/ML IV SOLN
INTRAVENOUS | Status: DC | PRN
  Administered 2012-08-22: 60 mg via INTRAVENOUS

## 2012-08-22 MED ORDER — DEXAMETHASONE SODIUM PHOSPHATE 10 MG/ML IJ SOLN
INTRAMUSCULAR | Status: DC | PRN
  Administered 2012-08-22: 10 mg via INTRAVENOUS

## 2012-08-22 MED ORDER — PROMETHAZINE HCL 25 MG/ML IJ SOLN: 6.2500 mg | INTRAMUSCULAR | Status: DC | PRN

## 2012-08-22 MED ORDER — HYDROMORPHONE HCL PF 1 MG/ML IJ SOLN
0.2500 mg | INTRAMUSCULAR | Status: DC | PRN
  Administered 2012-08-22: 0.25 mg via INTRAVENOUS

## 2012-08-22 MED ORDER — MUPIROCIN 2 % EX OINT
TOPICAL_OINTMENT | Freq: Two times a day (BID) | CUTANEOUS | Status: DC
  Administered 2012-08-22: 1 via NASAL
  Filled 2012-08-22: qty 22

## 2012-08-22 MED ORDER — ACETAMINOPHEN 10 MG/ML IV SOLN
INTRAVENOUS | Status: AC
  Filled 2012-08-22: qty 100

## 2012-08-22 MED ORDER — ACETAMINOPHEN 10 MG/ML IV SOLN
INTRAVENOUS | Status: DC | PRN
  Administered 2012-08-22: 1000 mg via INTRAVENOUS

## 2012-08-22 MED ORDER — OXYCODONE HCL 5 MG PO TABS: 5.0000 mg | ORAL_TABLET | Freq: Once | ORAL | Status: DC | PRN

## 2012-08-22 SURGICAL SUPPLY — 36 items
APPLIER CLIP LOGIC TI 5 (MISCELLANEOUS) IMPLANT
BENZOIN TINCTURE PRP APPL 2/3 (GAUZE/BANDAGES/DRESSINGS) IMPLANT
CABLE HIGH FREQUENCY MONO STRZ (ELECTRODE) ×2 IMPLANT
CLOTH BEACON ORANGE TIMEOUT ST (SAFETY) ×2 IMPLANT
DECANTER SPIKE VIAL GLASS SM (MISCELLANEOUS) IMPLANT
DERMABOND ADVANCED (GAUZE/BANDAGES/DRESSINGS) ×1
DERMABOND ADVANCED .7 DNX12 (GAUZE/BANDAGES/DRESSINGS) ×1 IMPLANT
DISSECT BALLN SPACEMKR + OVL (BALLOONS) ×2
DISSECTOR BALLN SPACEMKR + OVL (BALLOONS) ×1 IMPLANT
DISSECTOR BLUNT TIP ENDO 5MM (MISCELLANEOUS) IMPLANT
DRAPE LAPAROSCOPIC ABDOMINAL (DRAPES) ×2 IMPLANT
ELECT REM PT RETURN 9FT ADLT (ELECTROSURGICAL) ×2
ELECTRODE REM PT RTRN 9FT ADLT (ELECTROSURGICAL) ×1 IMPLANT
GLOVE BIOGEL PI IND STRL 7.0 (GLOVE) ×1 IMPLANT
GLOVE BIOGEL PI INDICATOR 7.0 (GLOVE) ×1
GLOVE EUDERMIC 7 POWDERFREE (GLOVE) ×16 IMPLANT
GOWN STRL NON-REIN LRG LVL3 (GOWN DISPOSABLE) ×4 IMPLANT
GOWN STRL REIN XL XLG (GOWN DISPOSABLE) ×4 IMPLANT
KIT BASIN OR (CUSTOM PROCEDURE TRAY) ×2 IMPLANT
MESH ULTRAPRO 3X6 7.6X15CM (Mesh General) ×4 IMPLANT
NEEDLE INSUFFLATION 14GA 120MM (NEEDLE) IMPLANT
SCISSORS LAP 5X35 DISP (ENDOMECHANICALS) IMPLANT
SET IRRIG TUBING LAPAROSCOPIC (IRRIGATION / IRRIGATOR) ×2 IMPLANT
SOLUTION ANTI FOG 6CC (MISCELLANEOUS) ×2 IMPLANT
STOPCOCK K 69 2C6206 (IV SETS) IMPLANT
STRIP CLOSURE SKIN 1/2X4 (GAUZE/BANDAGES/DRESSINGS) IMPLANT
SUT MNCRL AB 4-0 PS2 18 (SUTURE) ×4 IMPLANT
SUT VIC AB 3-0 SH 27 (SUTURE)
SUT VIC AB 3-0 SH 27XBRD (SUTURE) IMPLANT
TACKER 5MM HERNIA 3.5CML NAB (ENDOMECHANICALS) ×2 IMPLANT
TOWEL OR 17X26 10 PK STRL BLUE (TOWEL DISPOSABLE) ×2 IMPLANT
TRAY FOLEY CATH 14FRSI W/METER (CATHETERS) ×2 IMPLANT
TRAY LAP CHOLE (CUSTOM PROCEDURE TRAY) ×2 IMPLANT
TROCAR BLADELESS OPT 5 75 (ENDOMECHANICALS) IMPLANT
TROCAR CANNULA W/PORT DUAL 5MM (MISCELLANEOUS) ×4 IMPLANT
TUBING INSUFFLATION 10FT LAP (TUBING) ×2 IMPLANT

## 2012-08-22 NOTE — Anesthesia Postprocedure Evaluation (Signed)
Anesthesia Post Note  Patient: Lance Murphy  Procedure(s) Performed: Procedure(s) (LRB): LAPAROSCOPIC BILATERAL INGUINAL HERNIA REPAIR (Bilateral) INSERTION OF MESH (Bilateral)  Anesthesia type: General  Patient location: PACU  Post pain: Pain level controlled  Post assessment: Post-op Vital signs reviewed  Last Vitals: BP 124/71  Pulse 97  Temp 36.6 C (Oral)  Resp 15  Ht 5\' 8"  (1.727 m)  Wt 145 lb 4 oz (65.885 kg)  BMI 22.09 kg/m2  SpO2 97%  Post vital signs: Reviewed  Level of consciousness: sedated  Complications: No apparent anesthesia complications

## 2012-08-22 NOTE — Op Note (Signed)
Patient Name:           TREYVION DURKEE   Date of Surgery:        08/22/2012  Pre op Diagnosis:      Bilateral inguinal hernias  Post op Diagnosis:    Same  Procedure:                 Laparoscopic, preperitoneal repair of bilateral inguinal hernias with Ultra Pro mesh  Surgeon:                     Angelia Mould. Derrell Lolling, M.D., FACS  Assistant:                      None  Operative Indications:   Lance Murphy is a 47 y.o. male. He is referred by Prudy Feeler, PA at [facility] in [facility], for evaluation of a symptomatic right inguinal hernia.  This gentleman is here today with his wife. He works in [employer]. They travel from job site to job site and have a large trailer. His health is good except for some occasional asthma symptoms. He's had a laparoscopic cholecystectomy in 2008. He had an injury to his right hand several years ago requiring a right groin flap temporarily. he does not smoke.  He noticed a bulge in his right groin about one month ago. It's been getting larger and he has a double which is reducible. He gets a pulling sensation. No symptoms elsewhere. On examination he has a medium-sized right inguinal hernia which is obvious and a smaller left inguinal hernia. We discussed operative strategies, and have decided to proceed with laparoscopic repair of his bilateral inguinal hernias with mesh   Operative Findings:       On the right side and he had a moderate sized indirect hernia. The indirect hernia sac was empty and we were able to pull it out of the inguinal canal without much difficulty. No evidence of direct hernia on the right. On the  left-sided he had smaller indirect hernia containing a large lipoma which was reduced. The peritoneal edge when all the way up to the internal ring but I did not see it going into the internal ring.  Procedure in Detail:          Following the induction of general endotracheal anesthesia a Foley catheter was inserted and   the bladder was emptied. The abdomen and genitalia were prepped and draped in a sterile fashion. Intravenous antibiotics were given. Surgical time out was performed. 0.5% Marcaine with epinephrine was used as local infiltration anesthetic. A transverse incision was made at the lower rim of the umbilicus. The fascia was incised transversely exposing the medial border of the right rectus muscle. A spacemaker balloon was inserted into the right rectus sheath down to just above the symphysis pubis in the midline. The video camera was inserted and the balloon was inflated with air under direct vision. The balloon deployed nicely exposing the rectus muscles anteriorly, Cooper's ligament inferiorly, preperitoneal fat posteriorly. We held the balloon in place for about 2 minutes and then deflated and removed it. The operating trocar balloon was inflated and the operating trocar was secured to the insufflator  at 13 mm of mercury. The preperitoneal space was insufflated. There was minimal bleeding. We placed a 5 mm trocar in the midline below the umbilicus. Using this trocar site we cleaned the fascia off of the right and on the left  until we were above the level of the anterior superior iliac spine. I placed a 5 mm trocar on the right and on the left. On the right side I cleaned off Cooper's ligaments. I dissected all the areolar tissue off of the cord structures. I dissected the indirect hernia sac out of the internal ring and dissected all the way back up above the level of the anterior superior iliac spine. The left side I dissected the cord structures in similar fashion and reduced a large lipoma that was up in the internal ring. I dissected the edge of the peritoneum back until it  was above the level of the anterior superior iliac spine.  I repaird the hernias bilaterally with a 3" x 6" pieces of ultra Pro mesh, one piece of mesh on each side. These were positioned so as to overlap Cooper's ligament inferiorly a  little bit, the midline a little bit, and the mesh deployed the well lateral to the internal ring. The mesh was secured with a 5 mm protractor up the midline, across the superior rim of Cooper's ligament each side. Laterally I placed 2 or 3 tacks to secure the mesh. I was careful laterally to palpate the tacker through the abdominal wall laterally to stay above the iliopubic tract. After both pieces of mesh were placed the repair was inspected. Everything looked good. The hernia sac and lipoma were well back  posterior and superior to the edge of the mesh. There was no bleeding. The trocars were removed and the pneumoperitoneum released. The fascia at the umbilicus was closed with 2 figure-of-eight sutures of 0 Vicryl. All the skin incisions were closed with subcuticular sutures of 4 Monocryl and Dermabond. Patient tolerated the procedure well was taken to recovery in stable condition. EBL 15 cc. Counts correct. Complications none.     Angelia Mould. Derrell Lolling, M.D., FACS General and Minimally Invasive Surgery Breast and Colorectal Surgery  08/22/2012 2:25 PM

## 2012-08-22 NOTE — Transfer of Care (Signed)
Immediate Anesthesia Transfer of Care Note  Patient: Lance Murphy  Procedure(s) Performed: Procedure(s) (LRB) with comments: LAPAROSCOPIC BILATERAL INGUINAL HERNIA REPAIR (Bilateral) INSERTION OF MESH (Bilateral)  Patient Location: PACU  Anesthesia Type:General  Level of Consciousness: awake, alert  and oriented  Airway & Oxygen Therapy: Patient Spontanous Breathing and Patient connected to face mask oxygen  Post-op Assessment: Report given to PACU RN and Post -op Vital signs reviewed and stable  Post vital signs: Reviewed and stable  Complications: No apparent anesthesia complications

## 2012-08-22 NOTE — Preoperative (Signed)
Beta Blockers   Reason not to administer Beta Blockers:Not Applicable 

## 2012-08-22 NOTE — Interval H&P Note (Signed)
History and Physical Interval Note:  08/22/2012 12:34 PM  Lance Murphy  has presented today for surgery, with the diagnosis of bilateral inguinal hernia  The goals and the  various methods of treatment have been discussed with the patient and family. After consideration of risks, benefits and other options for treatment, the patient has consented to  Procedure(s) (LRB) with comments: LAPAROSCOPIC BILATERAL INGUINAL HERNIA REPAIR (Bilateral) INSERTION OF MESH (Bilateral) as a surgical intervention .  The patient's history has been reviewed, patient examined toand aday in the, no change in status, stable for surgery.  I have reviewed the patient's chart and labs.  Questions were answered to the patient's satisfaction.     Ernestene Mention

## 2012-08-22 NOTE — Anesthesia Preprocedure Evaluation (Addendum)
Anesthesia Evaluation  Patient identified by MRN, date of birth, ID band Patient awake    Reviewed: Allergy & Precautions, H&P , NPO status , Patient's Chart, lab work & pertinent test results  History of Anesthesia Complications (+) PONV  Airway Mallampati: I TM Distance: >3 FB Neck ROM: Full    Dental  (+) Dental Advisory Given and Missing,    Pulmonary asthma ,  breath sounds clear to auscultation  Pulmonary exam normal       Cardiovascular Exercise Tolerance: Good - Past MI and - CHF Rhythm:Regular Rate:Normal     Neuro/Psych negative neurological ROS  negative psych ROS   GI/Hepatic negative GI ROS, Neg liver ROS,   Endo/Other  negative endocrine ROS  Renal/GU Renal disease     Musculoskeletal negative musculoskeletal ROS (+)   Abdominal   Peds  Hematology negative hematology ROS (+)   Anesthesia Other Findings   Reproductive/Obstetrics                         Anesthesia Physical Anesthesia Plan  ASA: II  Anesthesia Plan: General   Post-op Pain Management:    Induction: Intravenous  Airway Management Planned: Oral ETT  Additional Equipment:   Intra-op Plan:   Post-operative Plan: Extubation in OR  Informed Consent: I have reviewed the patients History and Physical, chart, labs and discussed the procedure including the risks, benefits and alternatives for the proposed anesthesia with the patient or authorized representative who has indicated his/her understanding and acceptance.   Dental advisory given  Plan Discussed with: CRNA  Anesthesia Plan Comments:         Anesthesia Quick Evaluation

## 2012-08-23 ENCOUNTER — Encounter (HOSPITAL_COMMUNITY): Payer: Self-pay | Admitting: General Surgery

## 2012-08-29 ENCOUNTER — Telehealth (INDEPENDENT_AMBULATORY_CARE_PROVIDER_SITE_OTHER): Payer: Self-pay

## 2012-08-29 NOTE — Telephone Encounter (Signed)
The pt called in reporting a right sided pain that happened once that felt like a tearing velcro.  It is better now.  He is only on Ibuprofen and it helps.  He has a little bit of a lump in his testicle.  I told him this is probably expected and I told him about swelling and seromas that can occur after surgery.  I asked him to elevate his scrotum when he is resting or in a recliner.  I told him to try ice for the pain.  I asked him to call back in a day or so before the weekend in case he needs to be seen.

## 2012-09-11 ENCOUNTER — Ambulatory Visit (INDEPENDENT_AMBULATORY_CARE_PROVIDER_SITE_OTHER): Payer: 59 | Admitting: General Surgery

## 2012-09-11 ENCOUNTER — Encounter (INDEPENDENT_AMBULATORY_CARE_PROVIDER_SITE_OTHER): Payer: Self-pay | Admitting: General Surgery

## 2012-09-11 VITALS — BP 130/76 | HR 71 | Temp 98.0°F | Resp 18 | Ht 69.0 in | Wt 152.0 lb

## 2012-09-11 DIAGNOSIS — K4020 Bilateral inguinal hernia, without obstruction or gangrene, not specified as recurrent: Secondary | ICD-10-CM

## 2012-09-11 NOTE — Progress Notes (Signed)
Patient ID: Lance Murphy, male   DOB: 11-10-1965, 47 y.o.   MRN: [id number] History: This patient underwent laparoscopic repair of bilateral inguinal hernias with mesh on 08/22/2012. He has done well. Some bruising which is resolving. Some pain which is resolving. He feels good  Exam: Patient looks well. No distress. Umbilical incision well healed. Inguinal areas looked good. No evidence of  recurrence on standing examination. No scrotal mass. Testes feel normal.  Assessment: Bilateral inguinal hernias, recovering uneventfully following laparoscopic repair with mesh  Plan:   return to work without restrictions after January 22 Return to see me if further problems arise.   Angelia Mould. Derrell Lolling, M.D., [facility] General and Minimally invasive Surgery Breast and Colorectal Surgery Office:   [phone] Pager:   [phone]

## 2012-09-11 NOTE — Patient Instructions (Signed)
You seem to be healing from your laparoscopic bilateral inguinal hernia surgery without any obvious surgical complications.  You are restricted to 20 pounds lifting and no sports until after February 22. After that date there are no restrictions.  Return to see Dr. Derrell Lolling if further problems arise

## 2012-10-09 ENCOUNTER — Encounter (INDEPENDENT_AMBULATORY_CARE_PROVIDER_SITE_OTHER): Payer: Self-pay

## 2016-11-15 ENCOUNTER — Encounter: Payer: Self-pay | Admitting: Physician Assistant

## 2017-02-15 ENCOUNTER — Telehealth: Payer: Self-pay | Admitting: Physician Assistant

## 2017-02-15 NOTE — Telephone Encounter (Signed)
Called Ms. Roxan HockeyRobinson and told we hadn't got records [facility].

## 2017-08-03 ENCOUNTER — Ambulatory Visit (INDEPENDENT_AMBULATORY_CARE_PROVIDER_SITE_OTHER): Payer: BLUE CROSS/BLUE SHIELD | Admitting: Physician Assistant

## 2017-08-03 ENCOUNTER — Encounter: Payer: Self-pay | Admitting: Physician Assistant

## 2017-08-03 VITALS — BP 115/75 | HR 79 | Temp 98.0°F | Ht 69.0 in | Wt 164.0 lb

## 2017-08-03 DIAGNOSIS — Z0000 Encounter for general adult medical examination without abnormal findings: Secondary | ICD-10-CM

## 2017-08-03 DIAGNOSIS — G8929 Other chronic pain: Secondary | ICD-10-CM

## 2017-08-03 DIAGNOSIS — H9313 Tinnitus, bilateral: Secondary | ICD-10-CM | POA: Diagnosis not present

## 2017-08-03 DIAGNOSIS — M25512 Pain in left shoulder: Secondary | ICD-10-CM | POA: Diagnosis not present

## 2017-08-03 DIAGNOSIS — H9319 Tinnitus, unspecified ear: Secondary | ICD-10-CM | POA: Insufficient documentation

## 2017-08-03 MED ORDER — GABAPENTIN 100 MG PO CAPS: 100.0000 mg | ORAL_CAPSULE | Freq: Every day | ORAL | 11 refills | Status: DC

## 2017-08-03 MED ORDER — MELOXICAM 7.5 MG PO TABS: 7.5000 mg | ORAL_TABLET | Freq: Every day | ORAL | 3 refills | Status: DC

## 2017-08-03 NOTE — Patient Instructions (Signed)
Tinnitus Tinnitus refers to hearing a sound when there is no actual source for that sound. This is often described as ringing in the ears. However, people with this condition may hear a variety of noises. A person may hear the sound in one ear or in both ears. The sounds of tinnitus can be soft, loud, or somewhere in between. Tinnitus can last for a few seconds or can be constant for days. It may go away without treatment and come back at various times. When tinnitus is constant or happens often, it can lead to other problems, such as trouble sleeping and trouble concentrating. Almost everyone experiences tinnitus at some point. Tinnitus that is long-lasting (chronic) or comes back often is a problem that may require medical attention. What are the causes? The cause of tinnitus is often not known. In some cases, it can result from other problems or conditions, including:  Exposure to loud noises from machinery, music, or other sources.  Hearing loss.  Ear or sinus infections.  Earwax buildup.  A foreign object in the ear.  Use of certain medicines.  Use of alcohol and caffeine.  High blood pressure.  Heart diseases.  Anemia.  Allergies.  Meniere disease.  Thyroid problems.  Tumors.  An enlarged part of a weakened blood vessel (aneurysm).  What are the signs or symptoms? The main symptom of tinnitus is hearing a sound when there is no source for that sound. It may sound like:  Buzzing.  Roaring.  Ringing.  Blowing air, similar to the sound heard when you listen to a seashell.  Hissing.  Whistling.  Sizzling.  Humming.  Running water.  A sustained musical note.  How is this diagnosed? Tinnitus is diagnosed based on your symptoms. Your health care provider will do a physical exam. A comprehensive hearing exam (audiologic exam) will be done if your tinnitus:  Affects only one ear (unilateral).  Causes hearing difficulties.  Lasts 6 months or  longer.  You may also need to see a health care provider who specializes in hearing disorders (audiologist). You may be asked to complete a questionnaire to determine the severity of your tinnitus. Tests may be done to help determine the cause and to rule out other conditions. These can include:  Imaging studies of your head and brain, such as: ? A CT scan. ? An MRI.  An imaging study of your blood vessels (angiogram).  How is this treated? Treating an underlying medical condition can sometimes make tinnitus go away. If your tinnitus continues, other treatments may include:  Medicines, such as certain antidepressants or sleeping aids.  Sound generators to mask the tinnitus. These include: ? Tabletop sound machines that play relaxing sounds to help you fall asleep. ? Wearable devices that fit in your ear and play sounds or music. ? A small device that uses headphones to deliver a signal embedded in music (acoustic neural stimulation). In time, this may change the pathways of your brain and make you less sensitive to tinnitus. This device is used for very severe cases when no other treatment is working.  Therapy and counseling to help you manage the stress of living with tinnitus.  Using hearing aids or cochlear implants, if your tinnitus is related to hearing loss.  Follow these instructions at home:  When possible, avoid being in loud places and being exposed to loud sounds.  Wear hearing protection, such as earplugs, when you are exposed to loud noises.  Do not take stimulants, such as nicotine,   alcohol, or caffeine.  Practice techniques for reducing stress, such as meditation, yoga, or deep breathing.  Use a white noise machine, a humidifier, or other devices to mask the sound of tinnitus.  Sleep with your head slightly raised. This may reduce the impact of tinnitus.  Try to get plenty of rest each night. Contact a health care provider if:  You have tinnitus in just one  ear.  Your tinnitus continues for 3 weeks or longer without stopping.  Home care measures are not helping.  You have tinnitus after a head injury.  You have tinnitus along with any of the following: ? Dizziness. ? Loss of balance. ? Nausea and vomiting. This information is not intended to replace advice given to you by your health care provider. Make sure you discuss any questions you have with your health care provider. Document Released: 07/18/2005 Document Revised: 03/20/2016 Document Reviewed: 12/18/2013 Elsevier Interactive Patient Education  2018 [employer]  

## 2017-08-03 NOTE — Progress Notes (Signed)
BP 115/75   Pulse 79   Temp 98 F (36.7 C) (Oral)   Ht '5\' 9"'$  (1.753 m)   Wt 164 lb (74.4 kg)   BMI 24.22 kg/m    Subjective:    Patient ID: Lance Murphy, male    DOB: Dec 13, 1965, 52 y.o.   MRN: [id number]  HPI: Lance Murphy is a 52 y.o. male presenting on 08/03/2017 for Re-Establish Care  This patient comes in to be established.  I have seen him many times over the previous years.  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  He and his wife are currently in [place].  He is having increased pain in his left shoulder.  His right shoulder has been operated on and is much better.  The left one is starting to have decreased range of motion.  It is not dislocating the way the right one did.  He also is having tinnitus in both ears.  It is very bad at night when he tries to sleep.  He is a high risk for hearing damage due to work exposure and heavy machinery exposure.  Otherwise he states that he is doing fairly well.  He does need labs performed today for his annual blood work.  Relevant past medical, surgical, family and social history reviewed and updated as indicated. Allergies and medications reviewed and updated.  Past Medical History:  Diagnosis Date  . Allergic rhinitis   . Asthma   . Hernia of unspecified site of abdominal cavity without mention of obstruction or gangrene   . Inguinal hernia   . Kidney stones   . Osteoarthritis   . PONV (postoperative nausea and vomiting)   . Syncope    1998- medication caused syncopal episode     Past Surgical History:  Procedure Laterality Date  . CHOLECYSTECTOMY  2006  . HAND SURGERY  1989  . INGUINAL HERNIA REPAIR  08/22/2012   Procedure: LAPAROSCOPIC BILATERAL INGUINAL HERNIA REPAIR;  Surgeon: Adin Hector, MD;  Location: [facility];  Service: General;  Laterality: Bilateral;  . INSERTION OF MESH  08/22/2012   Procedure: INSERTION OF MESH;  Surgeon:  Adin Hector, MD;  Location: [facility];  Service: General;  Laterality: Bilateral;  . KNEE SURGERY  2005   due to accident  . SHOULDER SURGERY  2010   due to accident    Review of Systems  Constitutional: Negative.  Negative for appetite change and fatigue.  HENT: Positive for tinnitus. Negative for ear discharge and ear pain.   Eyes: Negative.  Negative for pain and visual disturbance.  Respiratory: Negative.  Negative for cough, chest tightness, shortness of breath and wheezing.   Cardiovascular: Negative.  Negative for chest pain, palpitations and leg swelling.  Gastrointestinal: Negative.  Negative for abdominal pain, diarrhea, nausea and vomiting.  Endocrine: Negative.   Genitourinary: Negative.   Musculoskeletal: Positive for arthralgias.  Skin: Negative.  Negative for color change and rash.  Neurological: Negative.  Negative for weakness, numbness and headaches.  Psychiatric/Behavioral: Negative.     Allergies as of 08/03/2017      Reactions   Cortisone Other (See Comments)   Syncope   Penicillins    Does not know of the reaction, happened in childhood.      Medication List        Accurate as of 08/03/17  1:22 PM. Always use your most recent med list.  acetaminophen 500 MG tablet Commonly known as:  TYLENOL Take 1,000 mg by mouth every 6 (six) hours as needed. Pain   albuterol 108 (90 Base) MCG/ACT inhaler Commonly known as:  PROVENTIL HFA;VENTOLIN HFA Inhale 2 puffs into the lungs every 4 (four) hours as needed. Wheezing   cetirizine 10 MG tablet Commonly known as:  ZYRTEC Take 10 mg by mouth daily.   Collagen 500 MG Caps Take by mouth.   gabapentin 100 MG capsule Commonly known as:  NEURONTIN Take 1-3 capsules (100-300 mg total) by mouth at bedtime.   meloxicam 7.5 MG tablet Commonly known as:  MOBIC Take 1 tablet (7.5 mg total) by mouth daily.   MULTIVITAMIN PO Take 1 tablet by mouth daily.   VITAMIN B COMPLEX PO Take 1 tablet by mouth  daily.   vitamin C 1000 MG tablet Take 1,000 mg by mouth daily.          Objective:    BP 115/75   Pulse 79   Temp 98 F (36.7 C) (Oral)   Ht '5\' 9"'$  (1.753 m)   Wt 164 lb (74.4 kg)   BMI 24.22 kg/m   Allergies  Allergen Reactions  . Cortisone Other (See Comments)    Syncope  . Penicillins     Does not know of the reaction, happened in childhood.    Physical Exam  Constitutional: He appears well-developed and well-nourished.  HENT:  Head: Normocephalic and atraumatic.  Eyes: Conjunctivae and EOM are normal. Pupils are equal, round, and reactive to light.  Neck: Normal range of motion. Neck supple.  Cardiovascular: Normal rate, regular rhythm and normal heart sounds.  Pulmonary/Chest: Effort normal and breath sounds normal.  Abdominal: Soft. Bowel sounds are normal.  Musculoskeletal:       Left shoulder: He exhibits decreased range of motion. He exhibits no effusion, no crepitus and no deformity.  Skin: Skin is warm and dry.   Results for orders placed or performed during the hospital encounter of 08/22/12  Surgical pcr screen  Result Value Ref Range   MRSA, PCR NEGATIVE NEGATIVE   Staphylococcus aureus NEGATIVE NEGATIVE   Results for orders placed or performed during the hospital encounter of 08/22/12  Surgical pcr screen  Result Value Ref Range   MRSA, PCR NEGATIVE NEGATIVE   Staphylococcus aureus NEGATIVE NEGATIVE      Assessment & Plan:   1. Tinnitus of both ears - gabapentin (NEURONTIN) 100 MG capsule; Take 1-3 capsules (100-300 mg total) by mouth at bedtime.  Dispense: 90 capsule; Refill: 11  2. Well adult exam - CBC with Differential/Platelet - CMP14+EGFR - Lipid panel  3. Chronic left shoulder pain  - meloxicam (MOBIC) 7.5 MG tablet; Take 1 tablet (7.5 mg total) by mouth daily.  Dispense: 90 tablet; Refill: 3    Current Outpatient Medications:  .  acetaminophen (TYLENOL) 500 MG tablet, Take 1,000 mg by mouth every 6 (six) hours as needed.  Pain, Disp: , Rfl:  .  albuterol (PROVENTIL HFA;VENTOLIN HFA) 108 (90 BASE) MCG/ACT inhaler, Inhale 2 puffs into the lungs every 4 (four) hours as needed. Wheezing, Disp: , Rfl:  .  Ascorbic Acid (VITAMIN C) 1000 MG tablet, Take 1,000 mg by mouth daily., Disp: , Rfl:  .  B Complex Vitamins (VITAMIN B COMPLEX PO), Take 1 tablet by mouth daily. , Disp: , Rfl:  .  cetirizine (ZYRTEC) 10 MG tablet, Take 10 mg by mouth daily., Disp: , Rfl:  .  Collagen 500 MG CAPS, Take  by mouth., Disp: , Rfl:  .  gabapentin (NEURONTIN) 100 MG capsule, Take 1-3 capsules (100-300 mg total) by mouth at bedtime., Disp: 90 capsule, Rfl: 11 .  meloxicam (MOBIC) 7.5 MG tablet, Take 1 tablet (7.5 mg total) by mouth daily., Disp: 90 tablet, Rfl: 3 .  Multiple Vitamins-Minerals (MULTIVITAMIN PO), Take 1 tablet by mouth daily. , Disp: , Rfl:  Continue all other maintenance medications as listed above.  Follow up plan: Return in about 1 year (around 08/03/2018).  Educational handout given for tinnitus  Terald Sleeper PA-C [place] [address]  [place], [place] [address] [phone]   08/03/2017, 1:22 PM

## 2017-08-04 LAB — CBC WITH DIFFERENTIAL/PLATELET
BASOS: 1 %
Basophils Absolute: 0 10*3/uL (ref 0.0–0.2)
EOS (ABSOLUTE): 0.1 10*3/uL (ref 0.0–0.4)
EOS: 1 %
HEMATOCRIT: 47.7 % (ref 37.5–51.0)
Hemoglobin: 16.4 g/dL (ref 13.0–17.7)
IMMATURE GRANS (ABS): 0 10*3/uL (ref 0.0–0.1)
IMMATURE GRANULOCYTES: 0 %
LYMPHS: 35 %
Lymphocytes Absolute: 2.2 10*3/uL (ref 0.7–3.1)
MCH: 30.8 pg (ref 26.6–33.0)
MCHC: 34.4 g/dL (ref 31.5–35.7)
MCV: 90 fL (ref 79–97)
Monocytes Absolute: 0.4 10*3/uL (ref 0.1–0.9)
Monocytes: 7 %
Neutrophils Absolute: 3.5 10*3/uL (ref 1.4–7.0)
Neutrophils: 56 %
Platelets: 207 10*3/uL (ref 150–379)
RBC: 5.32 x10E6/uL (ref 4.14–5.80)
RDW: 13.4 % (ref 12.3–15.4)
WBC: 6.2 10*3/uL (ref 3.4–10.8)

## 2017-08-04 LAB — CMP14+EGFR
ALT: 25 IU/L (ref 0–44)
AST: 30 IU/L (ref 0–40)
Albumin/Globulin Ratio: 1.5 (ref 1.2–2.2)
Albumin: 4.5 g/dL (ref 3.5–5.5)
Alkaline Phosphatase: 70 IU/L (ref 39–117)
BUN/Creatinine Ratio: 15 (ref 9–20)
BUN: 14 mg/dL (ref 6–24)
Bilirubin Total: 1.1 mg/dL (ref 0.0–1.2)
CO2: 23 mmol/L (ref 20–29)
Calcium: 10 mg/dL (ref 8.7–10.2)
Chloride: 104 mmol/L (ref 96–106)
Creatinine, Ser: 0.95 mg/dL (ref 0.76–1.27)
GFR, EST AFRICAN AMERICAN: 107 mL/min/{1.73_m2} (ref >59)
GFR, EST NON AFRICAN AMERICAN: 93 mL/min/{1.73_m2} (ref >59)
GLOBULIN, TOTAL: 3 g/dL (ref 1.5–4.5)
Glucose: 86 mg/dL (ref 65–99)
POTASSIUM: 4.6 mmol/L (ref 3.5–5.2)
SODIUM: 141 mmol/L (ref 134–144)
TOTAL PROTEIN: 7.5 g/dL (ref 6.0–8.5)

## 2017-08-04 LAB — LIPID PANEL
Chol/HDL Ratio: 4.5 ratio (ref 0.0–5.0)
Cholesterol, Total: 212 mg/dL — ABNORMAL HIGH (ref 100–199)
HDL: 47 mg/dL (ref >39)
LDL Calculated: 134 mg/dL — ABNORMAL HIGH (ref 0–99)
Triglycerides: 153 mg/dL — ABNORMAL HIGH (ref 0–149)
VLDL Cholesterol Cal: 31 mg/dL (ref 5–40)

## 2017-08-05 LAB — PSA, TOTAL AND FREE
PROSTATE SPECIFIC AG, SERUM: 2.5 ng/mL (ref 0.0–4.0)
PSA FREE PCT: 30 %
PSA, Free: 0.75 ng/mL

## 2017-08-05 LAB — SPECIMEN STATUS REPORT

## 2017-08-14 ENCOUNTER — Telehealth: Payer: Self-pay | Admitting: Physician Assistant

## 2017-08-14 NOTE — Telephone Encounter (Signed)
Yes, we can do it  

## 2017-08-14 NOTE — Telephone Encounter (Signed)
THEY will be mailing us the form that will need to be filled out -- they will write  ATTN: Prudy FeelerANGEL JONES on it

## 2018-04-17 ENCOUNTER — Encounter (INDEPENDENT_AMBULATORY_CARE_PROVIDER_SITE_OTHER): Payer: Self-pay

## 2018-04-17 ENCOUNTER — Encounter (INDEPENDENT_AMBULATORY_CARE_PROVIDER_SITE_OTHER): Payer: Self-pay | Admitting: Family

## 2018-04-17 ENCOUNTER — Ambulatory Visit (INDEPENDENT_AMBULATORY_CARE_PROVIDER_SITE_OTHER): Payer: BC Managed Care – PPO | Admitting: Family Medicine

## 2018-04-17 VITALS — BP 122/80 | HR 82 | Temp 99.1°F | Ht 69.0 in | Wt 155.0 lb

## 2018-04-17 DIAGNOSIS — M5416 Radiculopathy, lumbar region: Secondary | ICD-10-CM

## 2018-04-17 DIAGNOSIS — M5432 Sciatica, left side: Secondary | ICD-10-CM

## 2018-04-17 DIAGNOSIS — Z6822 Body mass index (BMI) 22.0-22.9, adult: Secondary | ICD-10-CM

## 2018-04-17 MED ORDER — CYCLOBENZAPRINE 5 MG TABLET
5.0000 mg | ORAL_TABLET | Freq: Three times a day (TID) | ORAL | 0 refills | Status: AC | PRN
Start: 2018-04-17 — End: ?

## 2018-04-17 MED ORDER — DICLOFENAC 1 % TOPICAL GEL: Tube | Freq: Four times a day (QID) | CUTANEOUS | 0 refills | 0 days | Status: AC

## 2018-04-17 NOTE — Progress Notes (Signed)
[facility]  [address]  [place] [address]  Office Visit    ID: Albert Huber   DOB: 1966-07-08  Date of Service: 04/17/2018     Chief Complaint(s):   Chief Complaint   Patient presents with   . Low Back Pain     Left side back pain       SUBJECTIVE:  Albert Huber comes in today with concerns left-sided lower back pain which radiates down into his left leg and left calf.  Patient reports he had this in the past couple months ago and this then returned about a week to 2 weeks ago after he stepped out of his truck.  States he has been trying to use stretching without too much relief.  Pain has gotten better since the weekend.  Denies any previous injury or history of imaging of his back.  He works Holiday representative.  Denies any bowel or bladder concerns.    ROS:  Constitutional: Denies fevers, chills, night sweats. No recent weight changes or fatigue.   Eyes: Denies change in vision. No irritation, erythema, discharge or pain.   Ears: Denies any difficulty with hearing. No ear pain, tinnitus or discharge.   Mouth/Throat: Denies any oral ulcers or other lesions. No sore throat, hoarseness or dysphagia.  Cardiovascular: Denies any chest pain, palpitations, PND or DOE  Respiratory: Denies any shortness of breath, wheezing, cough   GI: Denies any abdominal pain, nausea, vomiting, diarrhea or constipation. No melena or hematochezia.  GU: Denies any dysuria, frequency, hematuria, hesitancy. Denies nocturia or incontinence.  Musculoskeletal: Denies any arthritis or edema. No muscle/joint pain or swelling.   Neurological: + left sided sciatica   Skin: Denies any recent rashes or lesions.     Past Medical History:  There are no active problems to display for this patient.      Medications:  Current Outpatient Medications   Medication Sig   . cyclobenzaprine (FLEXERIL) 5 mg Oral Tablet Take 1 Tab (5 mg total) by mouth Three times a day as needed for Muscle spasms   . diclofenac sodium (VOLTAREN) 1 % Gel by  Apply Topically route Four times a day - before meals and bedtime        Allergies:  Allergies   Allergen Reactions   . Cortisone Mental Status Effect     Syncope   . Penicillins      Does not know of the reaction, happened in childhood.        Social History:  Social History     Tobacco Use   . Smoking status: Never Smoker   . Smokeless tobacco: Never Used   Substance Use Topics   . Alcohol use: Not on file   . Drug use: Not on file          OBJECTIVE:  BP 122/80   Pulse 82   Temp 37.3 C (99.1 F) (Oral)   Ht 1.753 m (5\' 9" )   Wt 70.3 kg (155 lb)   SpO2 98%   BMI 22.89 kg/m          Physical Exam:  General: No apparent acute distress. Very pleasant.   Eyes: Conjunctiva are clear. No discharge or icterus noted.  HENT: Mucous membranes are moist. Nares are clear.   Lungs: Clear to auscultation bilaterally. Good air movement. No wheezes or rhonchi.    Cardiovascular: Normal rate and regular rhythm. No murmurs, rubs or gallops.  Abdomen: Soft. BS present. Non-tender. No rebound, guarding,  or peritoneal signs.   Extremities: Atraumatic. No cyanosis. No significant peripheral edema.  Skin: Warm and dry.  No significant rashes or lesions  Neurologic: Strength and sensation grossly normal throughout.  Normal gait.  Negative straight leg raising test.   Psychiatric: Alert and oriented x 3. Affect within normal limits.    ASSESSMENT and PLAN:  1. Sciatica of left side -  Discussed that PT is the mainstay, gave him handout on stretches, he is improving each day, discussed contacting his PCP in regards to MRI if this keeps occurring.  Rx Flexeril to use at night.  He declined steroids at this time.    2. Lumbar radiculopathy                     Laurence Alyyan Tasman Zapata, DO 04/17/2018, 16:12        Visit Diagnoses and associated Orders -- Summary:        ICD-10-CM    1. Sciatica of left side M54.32 cyclobenzaprine (FLEXERIL) 5 mg Oral Tablet     diclofenac sodium (VOLTAREN) 1 % Gel   2. Lumbar radiculopathy M54.16          Portions  of this note may be dictated using voice recognition software.   Variances in spelling and vocabulary are possible and unintentional. Not all errors are caught/corrected. Please notify the [facility] if any discrepancies are noted or if the meaning of any statement is not clear.

## 2018-04-17 NOTE — Nursing Note (Signed)
BP 122/80   Pulse 82   Temp 37.3 C (99.1 F) (Oral)   Ht 1.753 m (5\' 9" )   Wt 70.3 kg (155 lb)   SpO2 98%   BMI 22.89 kg/m     Tia MaskerJamie Yetta Marceaux, RTR  04/17/2018, 15:34

## 2018-05-11 ENCOUNTER — Ambulatory Visit (INDEPENDENT_AMBULATORY_CARE_PROVIDER_SITE_OTHER): Payer: BLUE CROSS/BLUE SHIELD

## 2018-05-11 ENCOUNTER — Ambulatory Visit (INDEPENDENT_AMBULATORY_CARE_PROVIDER_SITE_OTHER): Payer: BLUE CROSS/BLUE SHIELD | Admitting: Family Medicine

## 2018-05-11 ENCOUNTER — Encounter: Payer: Self-pay | Admitting: Family Medicine

## 2018-05-11 VITALS — BP 109/71 | HR 78 | Temp 96.9°F | Wt 157.0 lb

## 2018-05-11 DIAGNOSIS — M5136 Other intervertebral disc degeneration, lumbar region: Secondary | ICD-10-CM | POA: Diagnosis not present

## 2018-05-11 DIAGNOSIS — Z23 Encounter for immunization: Secondary | ICD-10-CM | POA: Diagnosis not present

## 2018-05-11 DIAGNOSIS — M5442 Lumbago with sciatica, left side: Secondary | ICD-10-CM

## 2018-05-11 DIAGNOSIS — M545 Low back pain: Secondary | ICD-10-CM | POA: Diagnosis not present

## 2018-05-11 DIAGNOSIS — M4126 Other idiopathic scoliosis, lumbar region: Secondary | ICD-10-CM | POA: Diagnosis not present

## 2018-05-11 DIAGNOSIS — G8929 Other chronic pain: Secondary | ICD-10-CM

## 2018-05-11 MED ORDER — GABAPENTIN 300 MG PO CAPS: 300.0000 mg | ORAL_CAPSULE | Freq: Every day | ORAL | 1 refills | Status: AC

## 2018-05-11 MED ORDER — CYCLOBENZAPRINE HCL 5 MG PO TABS: 5.0000 mg | ORAL_TABLET | Freq: Three times a day (TID) | ORAL | 1 refills | Status: AC | PRN

## 2018-05-11 NOTE — Progress Notes (Signed)
Subjective: CC: Low back pain PCP: Remus Loffler, PA-C WUJ:[id number] Judie Petit Lance Murphy is a 52 y.o. male presenting to clinic today for:  1. Low back pain Patient with long-standing history of bilateral low back pain, with intermittent sciatica down the left lower extremity.  He had a recent flare and had to go to the urgent care for evaluation.  He was discharged with Flexeril and told to take ibuprofen.  He did have relief after a few days with this regimen.  He is concerned because he has a constant bilateral low back pain and stiffness, particularly at the end of his workday.  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  He denies any sensation changes to lower extremity's or falls.  Does not report any preceding injury.  No saddle anesthesia, fecal incontinence or urinary retention.  He does have meloxicam at home, which he was prescribed earlier this year but states that he had some GI side effects after taking and was unsure if it was related to the medication or from eating out that day.  He is not revisited that medicine.  Instead, he relies predominantly on Tylenol and ibuprofen.  He typically takes ibuprofen or Tylenol daily at the end of his workday.  He also performs stretches at home.  He is never seen an orthopedist for back pain before.  No recollection of previous imaging within the last year.  He thinks the last time he had his back imaged was greater than 7 years ago.   ROS: Per HPI  Allergies  Allergen Reactions  . Cortisone Other (See Comments)    Syncope  . Penicillins     Does not know of the reaction, happened in childhood.   Past Medical History:  Diagnosis Date  . Allergic rhinitis   . Asthma   . Hernia of unspecified site of abdominal cavity without mention of obstruction or gangrene   . Inguinal hernia   . Kidney stones   . Osteoarthritis   . PONV (postoperative nausea and vomiting)   . Syncope      1998- medication caused syncopal episode     Current Outpatient Medications:  .  acetaminophen (TYLENOL) 500 MG tablet, Take 1,000 mg by mouth every 6 (six) hours as needed. Pain, Disp: , Rfl:  .  albuterol (PROVENTIL HFA;VENTOLIN HFA) 108 (90 BASE) MCG/ACT inhaler, Inhale 2 puffs into the lungs every 4 (four) hours as needed. Wheezing, Disp: , Rfl:  .  Ascorbic Acid (VITAMIN C) 1000 MG tablet, Take 1,000 mg by mouth daily., Disp: , Rfl:  .  B Complex Vitamins (VITAMIN B COMPLEX PO), Take 1 tablet by mouth daily. , Disp: , Rfl:  .  cetirizine (ZYRTEC) 10 MG tablet, Take 10 mg by mouth daily., Disp: , Rfl:  .  Collagen 500 MG CAPS, Take by mouth., Disp: , Rfl:  .  gabapentin (NEURONTIN) 100 MG capsule, Take 1-3 capsules (100-300 mg total) by mouth at bedtime., Disp: 90 capsule, Rfl: 11 .  meloxicam (MOBIC) 7.5 MG tablet, Take 1 tablet (7.5 mg total) by mouth daily., Disp: 90 tablet, Rfl: 3 .  Multiple Vitamins-Minerals (MULTIVITAMIN PO), Take 1 tablet by mouth daily. , Disp: , Rfl:  Social History   Socioeconomic History  . Marital status: Married    Spouse name: Not on file  . Number of children: Not on file  . Years of education: Not on file  .  Highest education level: Not on file  Occupational History  . Not on file  Social Needs  . Financial resource strain: Not on file  . Food insecurity:    Worry: Not on file    Inability: Not on file  . Transportation needs:    Medical: Not on file    Non-medical: Not on file  Tobacco Use  . Smoking status: Former Smoker    Last attempt to quit: 08/02/1991    Years since quitting: 26.7  . Smokeless tobacco: Never Used  Substance and Sexual Activity  . Alcohol use: No  . Drug use: No  . Sexual activity: Not on file  Lifestyle  . Physical activity:    Days per week: Not on file    Minutes per session: Not on file  . Stress: Not on file  Relationships  . Social connections:    Talks on phone: Not on file    Gets together: Not on  file    Attends religious service: Not on file    Active member of club or organization: Not on file    Attends meetings of clubs or organizations: Not on file    Relationship status: Not on file  . Intimate partner violence:    Fear of current or ex partner: Not on file    Emotionally abused: Not on file    Physically abused: Not on file    Forced sexual activity: Not on file  Other Topics Concern  . Not on file  Social History Narrative  . Not on file   No family history on file.  Objective: Office vital signs reviewed. BP 109/71   Pulse 78   Temp (!) 96.9 F (36.1 C)   Wt 157 lb (71.2 kg)   BMI 23.18 kg/m   Physical Examination:  General: Awake, alert, well nourished, No acute distress Extremities: warm, well perfused, No edema, cyanosis or clubbing; +2 pulses bilaterally MSK: normal gait and station  Lumbar spine: Patient has full active range of motion all planes.  He does have some pain with extension.  No midline tenderness to palpation.  No paraspinal muscle tenderness palpation.  He does have a palpable scoliotic curve that feels mild in the center of the lumbar vertebra at approximately L3.  Negative straight leg raise bilaterally  Neuro: 5/5 lower extremity strength and light touch sensation grossly intact  Assessment/ Plan: 52 y.o. male   1. Chronic bilateral low back pain with left-sided sciatica Physical exam was remarkable for mild palpable scoliotic curve.  X-rays were obtained given chronicity and upon personal review there is appreciable decrease disc space within the L3 on L4 and especially on L4 on L5.  He has osteophytes.  Scoliotic curve to the left is noted.  This was discussed with the patient and his wife in today's visit.  Continue scheduled Tylenol and ibuprofen for underlying low back pain.  Gabapentin prescription as well as Flexeril prescription hand written and provided to the patient for PRN use.  Physical therapy exercises were reviewed with the  patient's wife and a handout was provided to them.  I will place a referral to orthopedics for further evaluation and will contact patient with the formal review by the radiologist of his x-ray. - DG Lumbar Spine 2-3 Views; Future - gabapentin (NEURONTIN) 300 MG capsule; Take 1 capsule (300 mg total) by mouth at bedtime. (for sciatic pain)  Dispense: 30 capsule; Refill: 1 - cyclobenzaprine (FLEXERIL) 5 MG tablet; Take 1 tablet (5  mg total) by mouth 3 (three) times daily as needed for muscle spasms. (low back pain)  Dispense: 30 tablet; Refill: 1  2. Need for immunization against influenza - Flu Vaccine QUAD 36+ mos IM   Orders Placed This Encounter  Procedures  . DG Lumbar Spine 2-3 Views    Standing Status:   Future    Number of Occurrences:   1    Standing Expiration Date:   07/11/2019    Order Specific Question:   Reason for Exam (SYMPTOM  OR DIAGNOSIS REQUIRED)    Answer:   back pain    Order Specific Question:   Preferred imaging location?    Answer:   Internal  . Flu Vaccine QUAD 36+ mos IM  . Ambulatory referral to Orthopedic Surgery    Referral Priority:   Routine    Referral Type:   Surgical    Referral Reason:   Specialty Services Required    Requested Specialty:   Orthopedic Surgery    Number of Visits Requested:   1   Meds ordered this encounter  Medications  . gabapentin (NEURONTIN) 300 MG capsule    Sig: Take 1 capsule (300 mg total) by mouth at bedtime. (for sciatic pain)    Dispense:  30 capsule    Refill:  1  . cyclobenzaprine (FLEXERIL) 5 MG tablet    Sig: Take 1 tablet (5 mg total) by mouth 3 (three) times daily as needed for muscle spasms. (low back pain)    Dispense:  30 tablet    Refill:  1     Eilee Schader Hulen Skains, DO [facility] [phone]

## 2018-05-11 NOTE — Patient Instructions (Signed)

## 2018-05-29 ENCOUNTER — Encounter: Payer: Self-pay | Admitting: Family Medicine

## 2018-08-31 ENCOUNTER — Encounter: Payer: BLUE CROSS/BLUE SHIELD | Admitting: Physician Assistant

## 2020-07-26 IMAGING — DX DG LUMBAR SPINE 2-3V
2 series · 2 of 2 positions shown · non-contrast
Comparison: None.

CLINICAL DATA: Low back pain

EXAM:
LUMBAR SPINE - 2-3 VIEW

[l-spine ap]
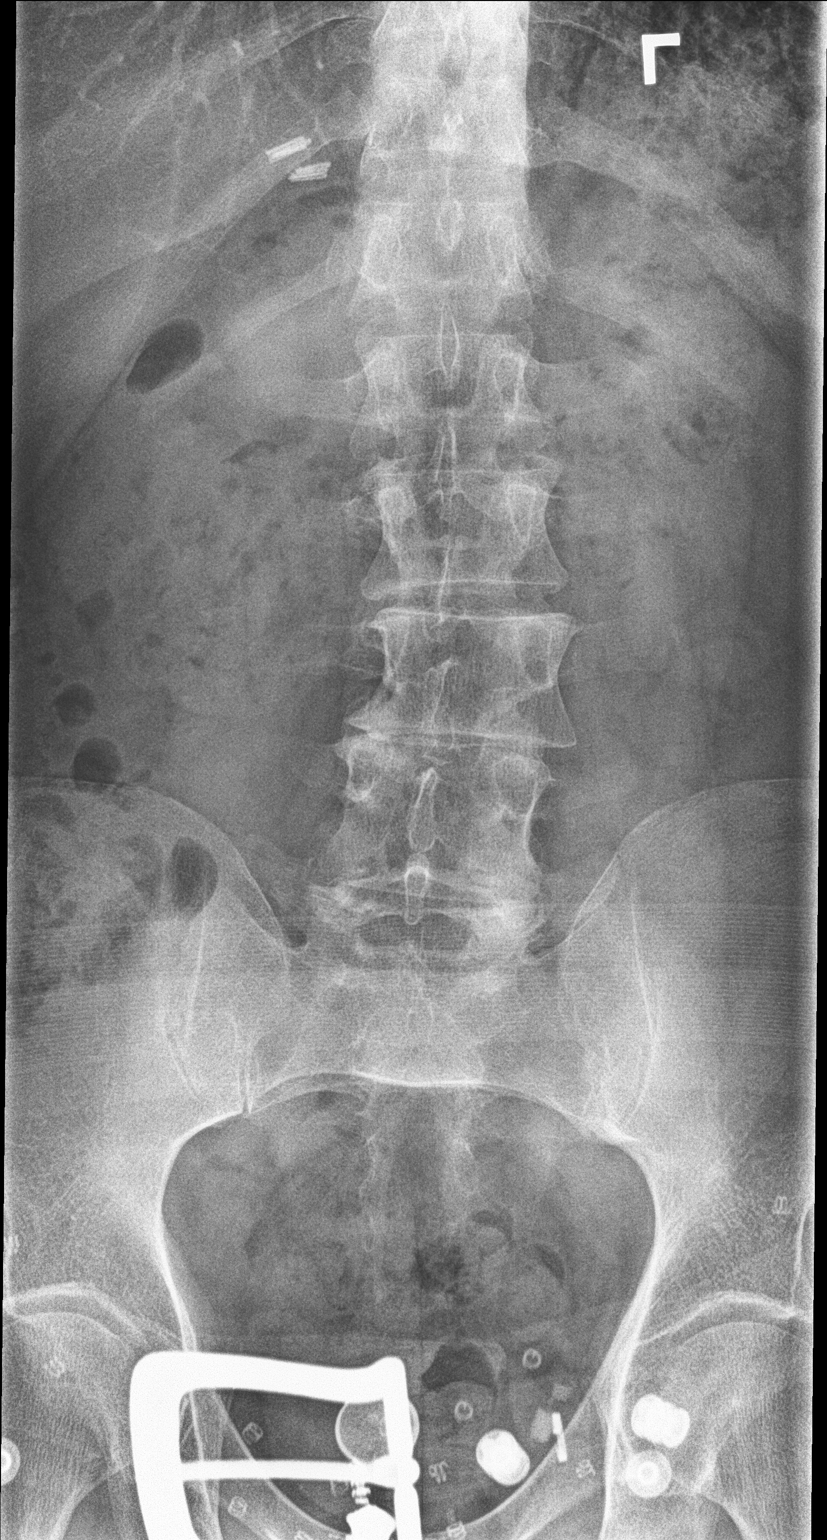

[l-spine lat]
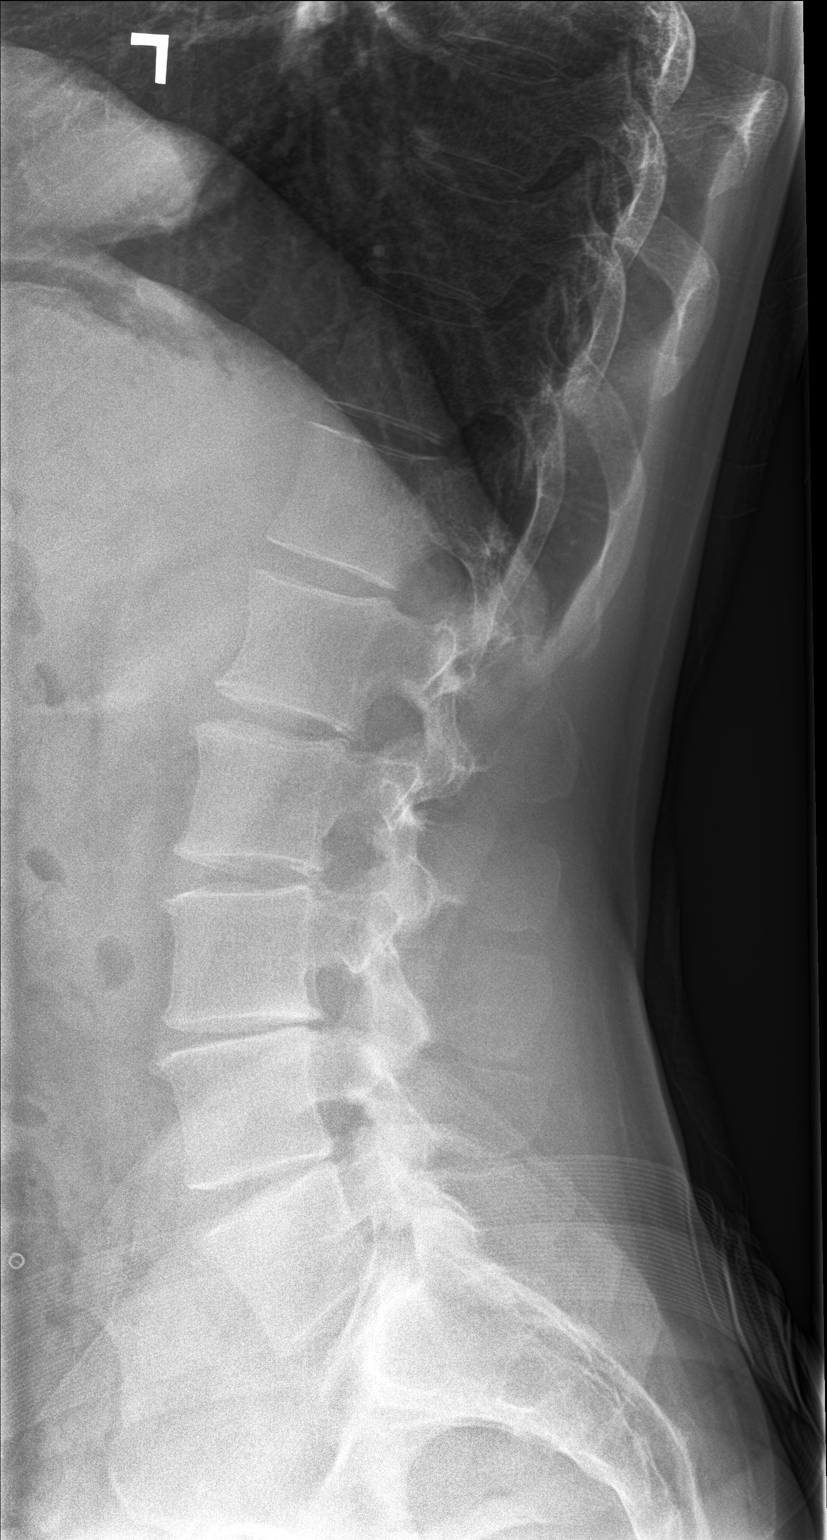

[2 of 2 positions shown; findings below may reference images not displayed]

FINDINGS: There are 5 nonrib bearing lumbar-type vertebral bodies.

The vertebral body heights are maintained.

There is 1-2 mm retrolisthesis of L3 on L4 and L4 on L5. There is
mild levocurvature of the lumbar spine.

There is no acute fracture.

There is degenerative disc disease with disc height loss throughout
the lumbar spine most severe at L3-4 and L4-5. There is bilateral
facet arthropathy at L5-S1.

The SI joints are unremarkable.
IMPRESSION: 1. Lumbar spine spondylosis as described above.
